# Patient Record
Sex: Male | Born: 1950
Health system: Southern US, Community
[De-identification: ages and names within clinical notes are randomized; demographics above are authoritative.]

## PROBLEM LIST (undated history)

## (undated) DIAGNOSIS — M1990 Unspecified osteoarthritis, unspecified site: Secondary | ICD-10-CM

## (undated) DIAGNOSIS — I10 Essential (primary) hypertension: Secondary | ICD-10-CM

## (undated) DIAGNOSIS — K219 Gastro-esophageal reflux disease without esophagitis: Secondary | ICD-10-CM

## (undated) DIAGNOSIS — N400 Enlarged prostate without lower urinary tract symptoms: Secondary | ICD-10-CM

## (undated) DIAGNOSIS — T300 Burn of unspecified body region, unspecified degree: Secondary | ICD-10-CM

## (undated) DIAGNOSIS — T753XXA Motion sickness, initial encounter: Secondary | ICD-10-CM

## (undated) DIAGNOSIS — T7840XA Allergy, unspecified, initial encounter: Secondary | ICD-10-CM

## (undated) DIAGNOSIS — E785 Hyperlipidemia, unspecified: Secondary | ICD-10-CM

## (undated) HISTORY — DX: Hyperlipidemia, unspecified: E78.5

## (undated) HISTORY — DX: Benign prostatic hyperplasia without lower urinary tract symptoms: N40.0

## (undated) HISTORY — DX: Motion sickness, initial encounter: T75.3XXA

## (undated) HISTORY — DX: Burn of unspecified body region, unspecified degree: T30.0

## (undated) HISTORY — DX: Essential (primary) hypertension: I10

## (undated) HISTORY — DX: Unspecified osteoarthritis, unspecified site: M19.90

## (undated) HISTORY — DX: Gastro-esophageal reflux disease without esophagitis: K21.9

## (undated) HISTORY — DX: Allergy, unspecified, initial encounter: T78.40XA

---

## 2002-06-03 ENCOUNTER — Emergency Department (HOSPITAL_COMMUNITY): Admission: EM | Admit: 2002-06-03 | Discharge: 2002-06-03 | Payer: Self-pay | Admitting: Emergency Medicine

## 2002-06-04 ENCOUNTER — Encounter: Payer: Self-pay | Admitting: Emergency Medicine

## 2002-06-05 ENCOUNTER — Emergency Department (HOSPITAL_COMMUNITY): Admission: EM | Admit: 2002-06-05 | Discharge: 2002-06-05 | Payer: Self-pay | Admitting: Emergency Medicine

## 2005-01-31 ENCOUNTER — Emergency Department (HOSPITAL_COMMUNITY): Admission: EM | Admit: 2005-01-31 | Discharge: 2005-01-31 | Payer: Self-pay | Admitting: Podiatry

## 2005-02-06 ENCOUNTER — Encounter: Payer: Self-pay | Admitting: Family Medicine

## 2005-02-06 ENCOUNTER — Ambulatory Visit: Payer: Self-pay | Admitting: Internal Medicine

## 2005-02-08 ENCOUNTER — Encounter: Payer: Self-pay | Admitting: Family Medicine

## 2005-02-08 ENCOUNTER — Ambulatory Visit (HOSPITAL_COMMUNITY): Admission: RE | Admit: 2005-02-08 | Discharge: 2005-02-08 | Payer: Self-pay | Admitting: Internal Medicine

## 2005-02-08 ENCOUNTER — Ambulatory Visit: Payer: Self-pay | Admitting: Internal Medicine

## 2005-02-15 ENCOUNTER — Ambulatory Visit: Payer: Self-pay | Admitting: Internal Medicine

## 2005-03-01 ENCOUNTER — Ambulatory Visit: Payer: Self-pay | Admitting: Internal Medicine

## 2006-10-13 ENCOUNTER — Emergency Department (HOSPITAL_COMMUNITY): Admission: EM | Admit: 2006-10-13 | Discharge: 2006-10-13 | Payer: Self-pay | Admitting: Emergency Medicine

## 2006-10-19 ENCOUNTER — Ambulatory Visit: Payer: Self-pay | Admitting: Internal Medicine

## 2006-10-19 ENCOUNTER — Encounter: Payer: Self-pay | Admitting: Family Medicine

## 2006-10-19 LAB — CONVERTED CEMR LAB
AST: 24 units/L (ref 0–37)
Albumin: 4 g/dL (ref 3.5–5.2)
BUN: 23 mg/dL (ref 6–23)
Basophils Absolute: 0 10*3/uL (ref 0.0–0.1)
Basophils Relative: 0.3 % (ref 0.0–1.0)
Bilirubin Urine: NEGATIVE
Bilirubin, Direct: 0.1 mg/dL (ref 0.0–0.3)
CO2: 30 meq/L (ref 19–32)
Calcium: 9.4 mg/dL (ref 8.4–10.5)
Cholesterol: 255 mg/dL (ref 0–200)
Creatinine, Ser: 1 mg/dL (ref 0.4–1.5)
GFR calc Af Amer: 100 mL/min
HDL: 86.8 mg/dL (ref >39.0)
Hemoglobin, Urine: NEGATIVE
Ketones, ur: NEGATIVE mg/dL
MCHC: 33.5 g/dL (ref 30.0–36.0)
Monocytes Absolute: 0.6 10*3/uL (ref 0.2–0.7)
Monocytes Relative: 7.6 % (ref 3.0–11.0)
PSA: 2.07 ng/mL (ref 0.10–4.00)
Platelets: 245 10*3/uL (ref 150–400)
Potassium: 3.8 meq/L (ref 3.5–5.1)
RDW: 12.1 % (ref 11.5–14.6)
Specific Gravity, Urine: 1.025 (ref 1.000–1.03)
Total CHOL/HDL Ratio: 2.9
Total Protein, Urine: NEGATIVE mg/dL
Triglycerides: 137 mg/dL (ref 0–149)
Urine Glucose: NEGATIVE mg/dL
VLDL: 27 mg/dL (ref 0–40)
pH: 6 (ref 5.0–8.0)

## 2006-10-22 ENCOUNTER — Ambulatory Visit: Payer: Self-pay | Admitting: Internal Medicine

## 2007-03-12 ENCOUNTER — Ambulatory Visit: Payer: Self-pay | Admitting: Internal Medicine

## 2007-03-19 ENCOUNTER — Encounter: Payer: Self-pay | Admitting: Family Medicine

## 2007-03-19 ENCOUNTER — Encounter: Admission: RE | Admit: 2007-03-19 | Discharge: 2007-03-19 | Payer: Self-pay | Admitting: Internal Medicine

## 2007-06-20 ENCOUNTER — Encounter: Payer: Self-pay | Admitting: Internal Medicine

## 2007-07-18 HISTORY — PX: OTHER SURGICAL HISTORY: SHX169

## 2008-09-01 ENCOUNTER — Ambulatory Visit: Payer: Self-pay | Admitting: Family Medicine

## 2008-09-01 DIAGNOSIS — M549 Dorsalgia, unspecified: Secondary | ICD-10-CM | POA: Insufficient documentation

## 2008-09-01 DIAGNOSIS — N400 Enlarged prostate without lower urinary tract symptoms: Secondary | ICD-10-CM

## 2008-09-02 ENCOUNTER — Ambulatory Visit: Payer: Self-pay | Admitting: Family Medicine

## 2008-09-02 DIAGNOSIS — E7800 Pure hypercholesterolemia, unspecified: Secondary | ICD-10-CM

## 2008-09-04 LAB — CONVERTED CEMR LAB
ALT: 21 units/L (ref 0–53)
AST: 23 units/L (ref 0–37)
Basophils Relative: 0.2 % (ref 0.0–3.0)
Bilirubin, Direct: 0.1 mg/dL (ref 0.0–0.3)
CO2: 28 meq/L (ref 19–32)
Calcium: 9.2 mg/dL (ref 8.4–10.5)
Chloride: 104 meq/L (ref 96–112)
Creatinine, Ser: 1 mg/dL (ref 0.4–1.5)
Direct LDL: 159.6 mg/dL
Eosinophils Relative: 1.3 % (ref 0.0–5.0)
Glucose, Bld: 85 mg/dL (ref 70–99)
Hemoglobin: 14.9 g/dL (ref 13.0–17.0)
Lymphocytes Relative: 21.1 % (ref 12.0–46.0)
Monocytes Relative: 7.8 % (ref 3.0–12.0)
Neutro Abs: 3.4 10*3/uL (ref 1.4–7.7)
PSA: 2.47 ng/mL (ref 0.10–4.00)
RBC: 4.95 M/uL (ref 4.22–5.81)
Total CHOL/HDL Ratio: 4.2
Total Protein: 7.4 g/dL (ref 6.0–8.3)
VLDL: 22 mg/dL (ref 0–40)
WBC: 4.9 10*3/uL (ref 4.5–10.5)

## 2008-09-08 ENCOUNTER — Ambulatory Visit: Payer: Self-pay | Admitting: Family Medicine

## 2008-12-01 ENCOUNTER — Ambulatory Visit: Payer: Self-pay | Admitting: Family Medicine

## 2008-12-01 LAB — CONVERTED CEMR LAB
Direct LDL: 147.5 mg/dL
HDL: 49.1 mg/dL (ref >39.00)
Total CHOL/HDL Ratio: 4
Vitamin B-12: 322 pg/mL (ref 211–911)

## 2008-12-03 LAB — CONVERTED CEMR LAB: Testosterone Free: 135.2 pg/mL (ref 47.0–244.0)

## 2008-12-07 ENCOUNTER — Ambulatory Visit: Payer: Self-pay | Admitting: Family Medicine

## 2009-03-09 ENCOUNTER — Ambulatory Visit: Payer: Self-pay | Admitting: Family Medicine

## 2009-03-19 LAB — CONVERTED CEMR LAB
Direct LDL: 144.1 mg/dL
Total CHOL/HDL Ratio: 4
Triglycerides: 83 mg/dL (ref 0.0–149.0)
VLDL: 16.6 mg/dL (ref 0.0–40.0)

## 2009-06-30 ENCOUNTER — Ambulatory Visit: Payer: Self-pay | Admitting: Family Medicine

## 2009-07-01 LAB — CONVERTED CEMR LAB
ALT: 17 units/L (ref 0–53)
AST: 25 units/L (ref 0–37)
Direct LDL: 136.8 mg/dL
HDL: 52.5 mg/dL (ref >39.00)

## 2010-03-14 ENCOUNTER — Encounter: Payer: Self-pay | Admitting: Family Medicine

## 2010-03-14 ENCOUNTER — Ambulatory Visit: Payer: Self-pay | Admitting: Family Medicine

## 2010-03-17 ENCOUNTER — Encounter (INDEPENDENT_AMBULATORY_CARE_PROVIDER_SITE_OTHER): Payer: Self-pay | Admitting: *Deleted

## 2010-03-18 ENCOUNTER — Ambulatory Visit: Payer: Self-pay | Admitting: Family Medicine

## 2010-08-14 LAB — CONVERTED CEMR LAB
Glucose, Urine, Semiquant: NEGATIVE
Nitrite: NEGATIVE
Protein, U semiquant: NEGATIVE
Specific Gravity, Urine: 1.02
Urobilinogen, UA: 0.2
WBC Urine, dipstick: NEGATIVE

## 2010-08-16 NOTE — Letter (Signed)
Summary: Out of Work  [facility]  [address] [place], [place] [address]   Phone: [phone]  Fax: [phone]    March 17, 2010   Employee:  LEVENT KORNEGAY    To Whom It May Concern:   For Medical reasons, please excuse the above named employee from work for the following dates:  Start:   March 14 2010  End:  March 17, 2010 9:36 AM May return to work today   If you need additional information, please feel free to contact our office.         Sincerely,   Hannah Beat MD

## 2010-08-16 NOTE — Assessment & Plan Note (Signed)
Summary: ACU FOR HURT BACK AND NECK FROM BAD FALL/JRR   Vital Signs:  Patient profile:   60 year old male Height:      63 inches Weight:      114.8 pounds BMI:     20.41 Temp:     98.5 degrees F oral Pulse rate:   64 / minute Pulse rhythm:   regular BP sitting:   140 / 80  (left arm) Cuff size:   regular  Vitals Entered By: Benny Lennert CMA Duncan Dull) (March 14, 2010 11:02 AM)  History of Present Illness: Chief complaint patient complains of back pain from fall off ladder yesterday  60 year old male, fell off of ladder:  date of injury March 14, 1999  Left knee, patient struck the medial aspect of his left knee. Now he has a significant effusion is having pain with flexion. No known prior injuries, fracture, traumatic injury or operative interventions. He does have some significant pain. There is some bruising on the medial aspect.  left elbow, abrasion is present. Full range of motion.  back pain hurting pretty bad did not hurt that back that much then.  doesn't history of some back pain, now he has pain in the bilateral paraspinous areas. Initially at the time of accident, he had minimal pain, but now he has some significant pain. No numbness or tingling. No radiculopathy. No bowel or bladder incontinence. No saddle anesthesia.   Allergies (verified): No Known Drug Allergies  Past History:  Past medical, surgical, family and social histories (including risk factors) reviewed, and no changes noted (except as noted below).  Past Surgical History: Reviewed history from 09/01/2008 and no changes required. epidural injections in 2009 Denies surgical history  Family History: Reviewed history from 09/01/2008 and no changes required. father: CAD, MI age 64 mother: lung cancer brother: bcak problems sister: shoulder issue  Social History: Reviewed history from 09/01/2008 and no changes required. 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 Otherwise the pertinent positives of the ROS are noted above.    Physical Exam  General:  GEN: Well-developed,well-nourished,in no acute distress; alert,appropriate and cooperative throughout examination HEENT: Normocephalic and atraumatic without obvious abnormalities. No apparent alopecia or balding. Ears, externally no deformities PULM: Breathing comfortably in no respiratory distress EXT: No clubbing, cyanosis, or edema PSYCH: Normally interactive. Cooperative during the interview. Pleasant. Friendly and conversant. Not anxious or depressed appearing. Normal, full affect.  Msk:  left knee: Tender to palpation medially. Stable MCL, LCL. Stable ACL and PCL. Negative Lachman. Large ballotable effusion. Bruising medially. All other special testing or equivocal given the effusion.  Left elbow, full range of motion. Nontender throughout. Abrasion is present Additional Exam:  Normal Greater trochanteric bursae Full hip ROM Negative Faber Negative Reverse Faber Sciatic Notches: ttp tender to palpation of the bilateral paraspinous region, however there is no midline spinous process tenderness Sensation to Gross touch WNL Sensation to pinpricnk WNL DTR 2+ knee and ankle no clonus DP and PT pulses are normal B    Impression & Recommendations:  Problem # 1:  BACK PAIN (ICD-724.5) Lumbar spine, 5 v some evidence of spondylarthropathy, but no acute fracture on my read  post fall pain, trauma meds below activity mod heat, massage  out of work 1 week  His updated medication list for this problem includes:    Diclofenac Sodium 75 Mg Tbec (Diclofenac  sodium) .Marland Kitchen... 1 by mouth bid . take with food    Tizanidine Hcl 4 Mg Tabs (Tizanidine hcl) .Marland Kitchen... 1 by mouth at bedtime as needed back pain    Tramadol Hcl 50 Mg Tabs (Tramadol hcl) .Marland Kitchen... 1 by mouth q 6 hours as needed pain  Orders: T-Lumbar Spine Complete, 5 Views (25366YQ)  Problem # 2:  KNEE PAIN, LEFT (ICD-719.46) cannot rule out occult internal derangement. No occult fracture. Meniscal injury possible. ligament integrity is intact.  His updated medication list for this problem includes:    Diclofenac Sodium 75 Mg Tbec (Diclofenac sodium) .Marland Kitchen... 1 by mouth bid . take with food    Tizanidine Hcl 4 Mg Tabs (Tizanidine hcl) .Marland Kitchen... 1 by mouth at bedtime as needed back pain    Tramadol Hcl 50 Mg Tabs (Tramadol hcl) .Marland Kitchen... 1 by mouth q 6 hours as needed pain  Orders: T-DG Knee 4V w/ Sunrise/Patella *L* (03474.4)  Problem # 3:  KNEE PAIN, LEFT (ICD-719.46) 4 view, x-ray left knee trauma series Indication pain No evidence of occult fracture. Koren Bound is present, however her only minimal arthritic changes and no other occult disease  His updated medication list for this problem includes:    Diclofenac Sodium 75 Mg Tbec (Diclofenac sodium) .Marland Kitchen... 1 by mouth bid . take with food    Tizanidine Hcl 4 Mg Tabs (Tizanidine hcl) .Marland Kitchen... 1 by mouth at bedtime as needed back pain    Tramadol Hcl 50 Mg Tabs (Tramadol hcl) .Marland Kitchen... 1 by mouth q 6 hours as needed pain  Orders: T-DG Knee 4V w/ Sunrise/Patella *L* (25956.4)  Complete Medication List: 1)  Red Yeast Rice 600 Mg Caps (Red yeast rice extract) .... 2400 mg divided daily 2)  Diclofenac Sodium 75 Mg Tbec (Diclofenac sodium) .Marland Kitchen.. 1 by mouth bid . take with food 3)  Tizanidine Hcl 4 Mg Tabs (Tizanidine hcl) .Marland Kitchen.. 1 by mouth at bedtime as needed back pain 4)  Tramadol Hcl 50 Mg Tabs (Tramadol hcl) .Marland Kitchen.. 1 by mouth q 6 hours as needed pain 5)  Back Support For Work  .... 724.5  Patient Instructions: 1)  recheck in 3-4 weeks Prescriptions: BACK SUPPORT  FOR WORK 724.5  #1 x 0   Entered and Authorized by:   Hannah Beat MD   Signed by:   Hannah Beat MD on 03/14/2010   Method used:   Print then Give to Patient   RxID:   [id number] TRAMADOL HCL 50 MG TABS (TRAMADOL HCL) 1 by mouth q 6 hours as needed pain  #40 x 0   Entered and Authorized by:   Hannah Beat MD   Signed by:   Hannah Beat MD on 03/14/2010   Method used:   [facility] [id number]       [address]       [place], [place]  [address]       Ph: [phone] or [phone]       Fax: [phone]   RxID:   [id number] TIZANIDINE HCL 4 MG TABS (TIZANIDINE HCL) 1 by mouth at bedtime as needed back pain  #30 x 1   Entered and Authorized by:   Hannah Beat MD   Signed by:   Hannah Beat MD  on 03/14/2010   Method used:   [facility]        [facility] [id number]       [address]       [place], [place]  [address]       Ph: [phone] or [phone]       Fax: [phone]   RxID:   [id number] DICLOFENAC SODIUM 75 MG TBEC (DICLOFENAC SODIUM) 1 by mouth bid . take with food  #60 x 1   Entered and Authorized by:   Hannah Beat MD   Signed by:   Hannah Beat MD on 03/14/2010   Method used:   [facility]        [facility] (retail)       [address]       [place], [place]  [address]       Ph: [phone] or [phone]       Fax: [phone]   RxID:   [id number]   Current Allergies (reviewed today): No known allergies

## 2010-08-16 NOTE — Letter (Signed)
Summary: Out of Work  [facility]  [address] [place], [place] [address]   Phone: [phone]  Fax: [phone]    March 14, 2010   Employee:  Carl Harris    To Whom It May Concern:   For Medical reasons, please excuse the above named employee from work for the following dates:  Start:   03/14/2010  End:   Ok to return 03/17/2010  If you need additional information, please feel free to contact our office.         Sincerely,    Hannah Beat MD

## 2010-08-16 NOTE — Letter (Signed)
Summary: Out of Work  [facility]  [address] [place], [place] [address]   Phone: [phone]  Fax: [phone]    March 14, 2010   Employee:  Livia Snellen    To Whom It May Concern:   For Medical reasons, please excuse the above named employee from work for the following dates:  Start:   03/14/2010  End:   03/21/2010  If you need additional information, please feel free to contact our office.         Sincerely,    Hannah Beat MD

## 2010-08-23 ENCOUNTER — Telehealth (INDEPENDENT_AMBULATORY_CARE_PROVIDER_SITE_OTHER): Payer: Self-pay | Admitting: *Deleted

## 2010-08-26 ENCOUNTER — Other Ambulatory Visit: Payer: Self-pay | Admitting: Family Medicine

## 2010-08-26 ENCOUNTER — Other Ambulatory Visit (INDEPENDENT_AMBULATORY_CARE_PROVIDER_SITE_OTHER): Payer: BC Managed Care – PPO

## 2010-08-26 ENCOUNTER — Encounter (INDEPENDENT_AMBULATORY_CARE_PROVIDER_SITE_OTHER): Payer: Self-pay | Admitting: *Deleted

## 2010-08-26 DIAGNOSIS — E7800 Pure hypercholesterolemia, unspecified: Secondary | ICD-10-CM

## 2010-08-26 DIAGNOSIS — Z125 Encounter for screening for malignant neoplasm of prostate: Secondary | ICD-10-CM

## 2010-08-26 DIAGNOSIS — E785 Hyperlipidemia, unspecified: Secondary | ICD-10-CM

## 2010-08-26 LAB — LDL CHOLESTEROL, DIRECT: Direct LDL: 153.6 mg/dL

## 2010-08-26 LAB — BASIC METABOLIC PANEL
BUN: 18 mg/dL (ref 6–23)
CO2: 30 mEq/L (ref 19–32)
Calcium: 9.2 mg/dL (ref 8.4–10.5)
Creatinine, Ser: 1 mg/dL (ref 0.4–1.5)
GFR: 81.46 mL/min (ref >60.00)
Glucose, Bld: 86 mg/dL (ref 70–99)
Sodium: 140 mEq/L (ref 135–145)

## 2010-08-26 LAB — LIPID PANEL
Cholesterol: 224 mg/dL — ABNORMAL HIGH (ref 0–200)
HDL: 54.1 mg/dL (ref >39.00)
Triglycerides: 150 mg/dL — ABNORMAL HIGH (ref 0.0–149.0)

## 2010-08-26 LAB — HEPATIC FUNCTION PANEL
Albumin: 3.7 g/dL (ref 3.5–5.2)
Total Protein: 6.7 g/dL (ref 6.0–8.3)

## 2010-08-26 LAB — PSA: PSA: 3.45 ng/mL (ref 0.10–4.00)

## 2010-08-30 ENCOUNTER — Encounter: Payer: Self-pay | Admitting: Family Medicine

## 2010-08-30 ENCOUNTER — Encounter (INDEPENDENT_AMBULATORY_CARE_PROVIDER_SITE_OTHER): Payer: BC Managed Care – PPO | Admitting: Family Medicine

## 2010-08-30 DIAGNOSIS — R079 Chest pain, unspecified: Secondary | ICD-10-CM | POA: Insufficient documentation

## 2010-08-30 DIAGNOSIS — M5430 Sciatica, unspecified side: Secondary | ICD-10-CM

## 2010-08-30 DIAGNOSIS — Z0000 Encounter for general adult medical examination without abnormal findings: Secondary | ICD-10-CM

## 2010-08-30 DIAGNOSIS — Z136 Encounter for screening for cardiovascular disorders: Secondary | ICD-10-CM

## 2010-08-30 DIAGNOSIS — E7800 Pure hypercholesterolemia, unspecified: Secondary | ICD-10-CM

## 2010-08-30 DIAGNOSIS — Z87898 Personal history of other specified conditions: Secondary | ICD-10-CM

## 2010-08-30 DIAGNOSIS — R3915 Urgency of urination: Secondary | ICD-10-CM | POA: Insufficient documentation

## 2010-08-30 DIAGNOSIS — R0789 Other chest pain: Secondary | ICD-10-CM | POA: Insufficient documentation

## 2010-08-30 DIAGNOSIS — R300 Dysuria: Secondary | ICD-10-CM

## 2010-08-30 DIAGNOSIS — Z23 Encounter for immunization: Secondary | ICD-10-CM

## 2010-09-01 NOTE — Progress Notes (Signed)
----   Converted from flag ---- ---- 08/23/2010 8:30 AM, Kerby Nora MD wrote: PSA Dx v76.44, CMET , lipids Dx v77.91  ---- 08/23/2010 7:53 AM, Liane Comber CMA (AAMA) wrote: Lab orders please! Good Morning! This pt is scheduled for cpx labs Friday, which labs to draw and dx codes to use? Thanks Tasha ------------------------------

## 2010-09-07 NOTE — Letter (Signed)
Summary: [facility]: Immunoassay Fecal Occult Blood (iFOB) Order Form  [facility]  [address] [place], [place] [address]   Phone: [phone]  Fax: [phone]       Lab: Immunoassay Fecal Occult Blood (iFOB) Order Form   August 30, 2010 MRN: [id number]   Carl Harris 09/07/1950   Physicican Name:____bedsole_____________________  Diagnosis Code:______v76.41____________________      Kerby Nora MD

## 2010-09-07 NOTE — Assessment & Plan Note (Signed)
Summary: cpx /rbh bcbs   Vital Signs:  Patient profile:   60 year old male Height:      63 inches Weight:      116.50 pounds BMI:     20.71 Temp:     97.6 degrees F oral Pulse rate:   65 / minute Pulse rhythm:   regular BP sitting:   90 / 72  (left arm) Cuff size:   regular  Vitals Entered By: Benny Lennert CMA Duncan Dull) (August 30, 2010 8:04 AM)  History of Present Illness: Chief complaint cpx  The patient is here for annual wellness exam and preventative care.      High cholesterol:  inadequate control. Not taking red yeast rice.  Trying to eat healthy  Chronic back pain... worsened symptoms 3 weeks ago.  Sharp pain in low back radiating to left buttock and down left leg. Pain increases with leaning forward. Gradually improving in last week. Not currently using and medication Has history of having steroid injection.  2 months ago.. was cutting tree.. limb flet directly on head... saw black for 1-2 seconds. head sore after this. No neck pain, no vision change, no confusion.   Occ chest tightness... once a month. Lasts few seconds. No radiation. No exertional pain. No relationship to eating. No associated symtpoms. Father: with CAD age 58. Never had stress test.    Hx of BPH Urination slower than usual for past year. Doesn't feel like empties out completely.  Has urinary urgency as well. Only urinating once at night.   No dysuria, no hematuria. Not bothering him enough to take daily medicine.  Problems Prior to Update: 1)  Chest Pain, Acute  (ICD-786.50) 2)  Sciatica, Left  (ICD-724.3) 3)  Urinary Urgency  (WUJ-811.91) 4)  Special Screening Malignant Neoplasm of Prostate  (ICD-V76.44) 5)  Well Adult Exam  (ICD-V70.0) 6)  Hypercholesterolemia  (ICD-272.0) 7)  Benign Prostatic Hypertrophy, Hx of  (ICD-V13.8) 8)  Back Pain, Chronic  (ICD-724.5)  Current Medications (verified): 1)  None  Allergies (verified): No Known Drug Allergies  Past  History:  Past medical, surgical, family and social histories (including risk factors) reviewed, and no changes noted (except as noted below).  Past Surgical History: Reviewed history from 09/01/2008 and no changes required. epidural injections in 2009 Denies surgical history  Family History: Reviewed history from 09/01/2008 and no changes required. father: CAD, MI age 96 mother: lung cancer brother: bcak problems sister: shoulder issue  Social History: Reviewed history from 09/01/2008 and no changes required. 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  Physical Exam  General:  Well-developed,well-nourished,in no acute distress; alert,appropriate and cooperative throughout examination Eyes:  No corneal or conjunctival inflammation noted. EOMI. Perrla. Funduscopic exam benign, without hemorrhages, exudates or papilledema. Vision grossly normal. Ears:  External ear exam shows no significant lesions or deformities.  Otoscopic examination reveals clear canals, tympanic membranes are intact bilaterally without bulging, retraction, inflammation or discharge. Hearing is grossly normal bilaterally. Nose:  External nasal examination shows no deformity or inflammation. Nasal mucosa are pink and moist without lesions or exudates. Mouth:  Oral mucosa and oropharynx without lesions or exudates.  Teeth in good repair. Neck:  no carotid bruit or thyromegaly no cervical or supraclavicular lymphadenopathy  Lungs:  Normal respiratory effort, chest expands symmetrically. Lungs are clear to auscultation, no crackles or wheezes. Heart:  Normal rate and  regular rhythm. S1 and S2 normal without gallop, murmur, click, rub or other extra sounds. Abdomen:  Bowel sounds positive,abdomen soft and non-tender without masses, organomegaly or hernias  noted. Rectal:  No external abnormalities noted. Normal sphincter tone. No rectal masses or tenderness. Genitalia:  Testes bilaterally descended without nodularity, tenderness or masses. No scrotal masses or lesions. No penis lesions or urethral discharge. Prostate:  3+ enlarged.  No nodules, no assymetry.  Msk:  ttp in left paraspinous muscles, no central lumbar vertebral pain.  Positive left SLR, neg Faber's   Full ROM in neck, no cervical ttp Pulses:  R and L posterior tibial pulses are full and equal bilaterally  Extremities:  no edema Neurologic:  No cranial nerve deficits noted. Station and gait are normal. Plantar reflexes are down-going bilaterally. DTRs are symmetrical throughout. Sensory, motor and coordinative functions appear intact. Skin:  Intact without suspicious lesions or rashes Psych:  Cognition and judgment appear intact. Alert and cooperative with normal attention span and concentration. No apparent delusions, illusions, hallucinations   Impression & Recommendations:  Problem # 1:  WELL ADULT EXAM (ICD-V70.0) The patient's preventative maintenance and recommended screening tests for an annual wellness exam were reviewed in full today. Brought up to date unless services declined.  Counselled on the importance of diet, exercise, and its role in overall health and mortality. The patient's FH and SH was reviewed, including their home life, tobacco status, and drug and alcohol status.     Problem # 2:  CHEST PAIN, ACUTE (ICD-786.50) CAD risk factors include family history and high cholesterol,  He is a non smoker, no HTN, good HDl. EKG showed NSR, no ST changes, no Q waves.  Given risk factors and chest pain without other clear cause.. will refer for treadmill stress test.  Orders: EKG w/ Interpretation (93000) Misc. Referral (Misc. Ref)  Problem # 3:  SCIATICA, LEFT (ICD-724.3) Treat with NSAIDs, heat and gentle exercise.. if not improving.    The following  medications were removed from the medication list:    Tizanidine Hcl 4 Mg Tabs (Tizanidine hcl) .Marland Kitchen... 1 by mouth at bedtime as needed back pain    Tramadol Hcl 50 Mg Tabs (Tramadol hcl) .Marland Kitchen... 1 by mouth q 6 hours as needed pain  Problem # 4:  HYPERCHOLESTEROLEMIA (ICD-272.0) Poor control .. discussed diet and exercise. Pt still hesitant about meds... never took red yeast rice.  Info given. Recheck in 3 months.   Problem # 5:  URINARY URGENCY (EAV-409.81) Likely due to BPH history. Enlargement but no nodularity on exam... PSA increase 1 point in last 2 years....will recheck free and total.  No sign of infection. Orders: UA Dipstick W/ Micro (manual) (19147)  Problem # 6:  BENIGN PROSTATIC HYPERTROPHY, HX OF (ICD-V13.8) Per pt symtpoms not bothering him enough for a med.   Other Orders: Tdap => 71yrs IM (82956) Admin 1st Vaccine (21308)  Patient Instructions: 1)  Work on low cholesterol diet. Start regular exercsie 3-5 times a week. 2)   Recheck fasting LIPIDS, AST, ALT  in 3 months Dx 272.0 , PSa free and total Dx v76.44 3)   Return stool cards for colon cancer screening. 4)   Heat on low back ibuprofen 800 mg three times a day as needed pain, gentle stretching . 5)  Call if back pain not improving.  6)  Referral Appointment Information 7)  Day/Date: 8)  Time: 9)  Place/MD: 10)  Address: 11)  Phone/Fax: 12)  Patient given appointment information.  Information/Orders faxed/mailed.    Orders Added: 1)  Tdap => 47yrs IM [90715] 2)  Admin 1st Vaccine [90471] 3)  UA Dipstick W/ Micro (manual) [81000] 4)  EKG w/ Interpretation [93000] 5)  Est. Patient 40-64 years [99396] 6)  Est. Patient Level III [16109] 7)  Misc. Referral [Misc. Ref]   Immunizations Administered:  Tetanus Vaccine:    Vaccine Type: Tdap    Site: left deltoid    Mfr: GlaxoSmithKline    Dose: 0.5 ml    Route: IM    Given by: Benny Lennert CMA (AAMA)    Exp. Date: 05/05/2012    Lot #: [id number]    VIS  given: 06/03/08 version given August 30, 2010.   Immunizations Administered:  Tetanus Vaccine:    Vaccine Type: Tdap    Site: left deltoid    Mfr: GlaxoSmithKline    Dose: 0.5 ml    Route: IM    Given by: Benny Lennert CMA (AAMA)    Exp. Date: 05/05/2012    Lot #: [id number]    VIS given: 06/03/08 version given August 30, 2010.  Current Allergies (reviewed today): No known allergies   Flu Vaccine Result Date:  04/16/2010 Flu Vaccine Result:  given Flu Vaccine Next Due:  1 yr TD Result Date:  08/30/2010 TD Result:  given TD Next Due:  10 yr Flex Sig Next Due:  Not Indicated Colonoscopy Next Due:  Refused      Appended Document: cpx /rbh bcbs     Allergies: No Known Drug Allergies   Other Orders: UA Dipstick w/o Micro (manual) (13086)   Orders Added: 1)  UA Dipstick w/o Micro (manual) [81002]     Laboratory Results   Urine Tests  Date/Time Received: August 30, 2010 9:06 AM   Routine Urinalysis   Color: yellow Appearance: Clear Glucose: negative   (Normal Range: Negative) Bilirubin: negative   (Normal Range: Negative) Ketone: negative   (Normal Range: Negative) Spec. Gravity: 1.015   (Normal Range: 1.003-1.035) Blood: negative   (Normal Range: Negative) pH: 6.0   (Normal Range: 5.0-8.0) Protein: trace   (Normal Range: Negative) Urobilinogen: 0.2   (Normal Range: 0-1) Nitrite: negative   (Normal Range: Negative) Leukocyte Esterace: negative   (Normal Range: Negative)

## 2010-09-08 ENCOUNTER — Ambulatory Visit (INDEPENDENT_AMBULATORY_CARE_PROVIDER_SITE_OTHER): Payer: BC Managed Care – PPO | Admitting: Cardiovascular Disease

## 2010-09-08 ENCOUNTER — Encounter: Payer: Self-pay | Admitting: Cardiovascular Disease

## 2010-09-08 ENCOUNTER — Encounter (INDEPENDENT_AMBULATORY_CARE_PROVIDER_SITE_OTHER): Payer: BC Managed Care – PPO

## 2010-09-08 DIAGNOSIS — E785 Hyperlipidemia, unspecified: Secondary | ICD-10-CM

## 2010-09-08 DIAGNOSIS — R079 Chest pain, unspecified: Secondary | ICD-10-CM

## 2010-09-08 DIAGNOSIS — Z8249 Family history of ischemic heart disease and other diseases of the circulatory system: Secondary | ICD-10-CM

## 2010-09-13 NOTE — Assessment & Plan Note (Signed)
Summary: Chest pain/AMD   Visit Type:  Initial Consult Primary Provider:  Kerby Nora MD  CC:  c/o feeling fatigue and shortness of breath and chest pain.Marland Kitchen  History of Present Illness: 60 yo male with h/o hyperlipidemia, strong family hx of CAD with father who passed with MI at age 47, presents with chest pain epsiodes.  He reports having episodes of chest pain once every two weeks, lasting for up to a minute at a time. He did have episodes of SOB with associated cough several years ago. No recent shortness of breath. he is otherwise active. He does not participate in an exercise program. He has been trying to watch his diet given his elevated cholesterol. More recently, he has been doing a slow jog, stretching exercises.  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  The patient denies fever, weight loss, weight gain, vision loss, decreased hearing, hoarseness, syncope, dyspnea on exertion, peripheral edema, prolonged cough, abdominal pain, incontinence, muscle weakness, depression, and enlarged lymph nodes.    Vital Signs:  Patient profile:   60 year old male Height:      63 inches Weight:       113 pounds BMI:     20.09 Pulse rate:   68 / minute BP sitting:   98 / 68  (left arm) Cuff size:   regular  Vitals Entered By: Bishop Dublin, CMA (September 08, 2010 9:02 AM)  Physical Exam  General:  Well developed, well nourished, in no acute distress. Head:  normocephalic and atraumatic Neck:  Neck supple, no JVD. No masses, thyromegaly or abnormal cervical nodes. Lungs:  Clear bilaterally to auscultation and percussion. Heart:  Non-displaced PMI, chest non-tender; regular rate and rhythm, S1, S2 without murmurs, rubs or gallops. Carotid upstroke normal, no bruit.  Pedals normal pulses. No edema, no varicosities. Abdomen:  Bowel sounds positive; abdomen soft and non-tender without masses Msk:  Back normal, normal gait. Muscle strength and tone normal. Pulses:  pulses normal in all 4 extremities Extremities:  No clubbing or cyanosis. Neurologic:  Alert and oriented x 3. Skin:  Intact without lesions or rashes. Psych:  Normal affect.   Impression & Recommendations:  Problem # 1:  CHEST PAIN, ACUTE (ICD-786.50) etiology of chest pain is uncertain. It does come on at rest and with exertion there was brief and no other associated symptoms. As he does have a strong family history of coronary artery disease, and he himself has very elevated cholesterol, we will have him complete a exercise treadmill study to rule out ischemia.  We have suggested he start aspirin 81 mg daily  His updated medication list for this problem includes:    Aspirin 81 Mg Tabs (Aspirin) .Marland Kitchen... Take one tablet once daily  Problem # 2:  HYPERCHOLESTEROLEMIA (ICD-272.0) we  have talked to him about his cholesterol. He has been elevated over the past 3 or more years. Given his strong family history of coronary artery disease and MI with death his father in his early 50s, we have suggested he try a low-dose cholesterol medication. My suspicion is that given he is very slim, diet and exercise will have only mild  improvement in his numbers.  We will start Lipitor 10 mg daily with a check of his cholesterol and LFTs with Dr. Ermalene Searing in 3 months time. We will have him [facility] weeks if he has no significant symptoms from Lipitor 10 mg.  His updated medication list for this problem includes:    Lipitor 10 Mg Tabs (Atorvastatin calcium) .Marland Kitchen... Take one tablet at bedtime  Patient Instructions: 1)  Your physician recommends that you schedule a follow-up appointment in: as needed 2)  Your physician has recommended you make the following change in your medication: Start Lipitor 10 mg one tablet at bedtime. Start aspirin 81 mg take one tablet once daily. Prescriptions: LIPITOR 10 MG TABS (ATORVASTATIN CALCIUM) take one tablet at bedtime  #30 x 1   Entered by:   Bishop Dublin, CMA   Authorized by:   Dossie Arbour MD   Signed by:   Bishop Dublin, CMA on 09/08/2010   Method used:   [facility]       [address]       [place], [place]  [address]       Ph: [phone] or [phone]       Fax: [phone]   RxID:   [id number]

## 2010-09-13 NOTE — Assessment & Plan Note (Signed)
Summary: Treadmill Visit   History of Present Illness: Patient presents for treadmill with recent episodes of chest pain, one episode every 2 weeks lasting for up to one minute also history of high cholesterol, strong family history of coronary artery disease.  Resting EKG shows normal sinus rhythm with rate 70 beats per minute, nonspecific ST changes in the anterolateral leads and inferior leads. resting blood pressure 98/68  He exercised on a regular Bruce protocol. Target heart rate was 137 beats per minute. He achieved a heart rate of 144 beats per minute (89%). He exercised for 10 minutes, achieved 12.9 METS, no significant ST depressions in anterolateral leads concerning for ischemia. peak blood pressure 120/80. he had no symptoms of chest pain. He did have some fatigue and shortness of breath at peak exercise. Heart rate after 3 minutes in recovery improved to 90 beats per minute with blood pressure 100/58.  Final impression; Normal exercise treadmill study with no EKG changes concerning for ischemia. Good exercise tolerance with no symptoms of chest discomfort. No further testing indicated at this time. As discussed in the visit note today, he was started on aspirin 81 mg daily and Lipitor 10 mg daily.  Allergies: No Known Drug Allergies

## 2010-10-14 ENCOUNTER — Other Ambulatory Visit: Payer: BC Managed Care – PPO

## 2010-10-14 DIAGNOSIS — Z1212 Encounter for screening for malignant neoplasm of rectum: Secondary | ICD-10-CM

## 2010-11-28 ENCOUNTER — Other Ambulatory Visit: Payer: Self-pay | Admitting: Family Medicine

## 2010-11-28 DIAGNOSIS — E7800 Pure hypercholesterolemia, unspecified: Secondary | ICD-10-CM

## 2010-11-28 DIAGNOSIS — Z125 Encounter for screening for malignant neoplasm of prostate: Secondary | ICD-10-CM

## 2010-11-29 ENCOUNTER — Other Ambulatory Visit (INDEPENDENT_AMBULATORY_CARE_PROVIDER_SITE_OTHER): Payer: BC Managed Care – PPO | Admitting: Family Medicine

## 2010-11-29 DIAGNOSIS — N400 Enlarged prostate without lower urinary tract symptoms: Secondary | ICD-10-CM

## 2010-11-29 DIAGNOSIS — E7800 Pure hypercholesterolemia, unspecified: Secondary | ICD-10-CM

## 2010-11-29 LAB — LIPID PANEL
Cholesterol: 225 mg/dL — ABNORMAL HIGH (ref 0–200)
Total CHOL/HDL Ratio: 4
Triglycerides: 106 mg/dL (ref 0.0–149.0)

## 2010-11-30 LAB — PSA, TOTAL AND FREE
PSA, Free Pct: 13 % — ABNORMAL LOW (ref >25)
PSA, Free: 0.7 ng/mL
PSA: 5.24 ng/mL — ABNORMAL HIGH (ref <=4.00)

## 2010-12-02 ENCOUNTER — Telehealth: Payer: Self-pay | Admitting: Family Medicine

## 2010-12-02 DIAGNOSIS — R972 Elevated prostate specific antigen [PSA]: Secondary | ICD-10-CM

## 2010-12-02 DIAGNOSIS — Z87898 Personal history of other specified conditions: Secondary | ICD-10-CM

## 2010-12-02 NOTE — Telephone Encounter (Signed)
Message copied by Kerby Nora on Fri Dec 02, 2010  1:09 PM ------      Message from: Benny Lennert      Created: Fri Dec 02, 2010  8:08 AM       Patient is agreeable. Patient wasn't taken medication on regular basis

## 2011-02-08 ENCOUNTER — Encounter: Payer: Self-pay | Admitting: Cardiovascular Disease

## 2013-03-18 ENCOUNTER — Ambulatory Visit (INDEPENDENT_AMBULATORY_CARE_PROVIDER_SITE_OTHER): Payer: BC Managed Care – PPO | Admitting: Family Medicine

## 2013-03-18 ENCOUNTER — Encounter: Payer: Self-pay | Admitting: Family Medicine

## 2013-03-18 VITALS — BP 120/80 | HR 72 | Temp 98.1°F | Ht 62.0 in | Wt 113.5 lb

## 2013-03-18 DIAGNOSIS — Z1212 Encounter for screening for malignant neoplasm of rectum: Secondary | ICD-10-CM

## 2013-03-18 DIAGNOSIS — M549 Dorsalgia, unspecified: Secondary | ICD-10-CM

## 2013-03-18 DIAGNOSIS — R972 Elevated prostate specific antigen [PSA]: Secondary | ICD-10-CM

## 2013-03-18 DIAGNOSIS — E7800 Pure hypercholesterolemia, unspecified: Secondary | ICD-10-CM

## 2013-03-18 DIAGNOSIS — Z0000 Encounter for general adult medical examination without abnormal findings: Secondary | ICD-10-CM

## 2013-03-18 DIAGNOSIS — Z23 Encounter for immunization: Secondary | ICD-10-CM

## 2013-03-18 LAB — COMPREHENSIVE METABOLIC PANEL
AST: 31 U/L (ref 0–37)
Albumin: 4 g/dL (ref 3.5–5.2)
Alkaline Phosphatase: 60 U/L (ref 39–117)
BUN: 24 mg/dL — ABNORMAL HIGH (ref 6–23)
Glucose, Bld: 83 mg/dL (ref 70–99)
Potassium: 4.2 mEq/L (ref 3.5–5.1)
Sodium: 137 mEq/L (ref 135–145)
Total Bilirubin: 0.8 mg/dL (ref 0.3–1.2)
Total Protein: 7 g/dL (ref 6.0–8.3)

## 2013-03-18 LAB — LIPID PANEL
Cholesterol: 238 mg/dL — ABNORMAL HIGH (ref 0–200)
Total CHOL/HDL Ratio: 4
Triglycerides: 155 mg/dL — ABNORMAL HIGH (ref 0.0–149.0)
VLDL: 31 mg/dL (ref 0.0–40.0)

## 2013-03-18 NOTE — Assessment & Plan Note (Signed)
Due for re-eval. 

## 2013-03-18 NOTE — Progress Notes (Signed)
  Subjective:    Patient ID: Carl Harris, male    DOB: December 30, 1950, 61 y.o.   MRN: [id number]  HPI  62 year old male presents for follow up urgent care appt for back pain. He has not been seen in a while...since 2012.  He states he needs a physical. This is what he wants to do today.  He reports back pain is  almost resolved.  He is feeling better than in past as far as fatigue. He feels better overall.  He does report some coughing fits occ and oc chest pain ( as in past 1-2 times  Year, none recently).  Non exertional chest pain and improved with massage. EKG  nml in past.  Does have family history of CAD.    Review of Systems  Constitutional: Positive for fatigue. Negative for fever.       Stable.  HENT: Negative for ear pain.   Eyes: Negative for pain.  Respiratory: Negative for shortness of breath.   Cardiovascular: Positive for chest pain. Negative for leg swelling.  Gastrointestinal: Negative for nausea, abdominal pain, diarrhea and constipation.  Genitourinary: Negative for dysuria, urgency and frequency.  Neurological: Positive for dizziness. Negative for syncope.  Psychiatric/Behavioral: Negative for behavioral problems, dysphoric mood and decreased concentration.       Objective:   Physical Exam        Assessment & Plan:  The patient's preventative maintenance and recommended screening tests for an annual wellness exam were reviewed in full today. Brought up to date unless services declined.  Counselled on the importance of diet, exercise, and its role in overall health and mortality. The patient's FH and SH was reviewed, including their home life, tobacco status, and drug and alcohol status.    Vaccines Td uptodate, will get flu vaccine stoday.   Colonoscopy: never had. Will do yearly ifob. Lab Results  Component Value Date   PSA 5.24* 11/29/2010   PSA 3.45 08/26/2010   PSA 2.47 09/02/2008

## 2013-03-18 NOTE — Patient Instructions (Addendum)
Look into insurance coverage for shingles vaccine. Flu shot given today.  Stop at lab on way out. Follow up for an appointment if cough is continuing or if chest pain recurs... Can try allergy medicaiton.

## 2013-03-19 LAB — PSA, TOTAL AND FREE: PSA: 5.93 ng/mL — ABNORMAL HIGH (ref <=4.00)

## 2013-03-21 ENCOUNTER — Telehealth: Payer: Self-pay | Admitting: Family Medicine

## 2013-03-21 DIAGNOSIS — E7800 Pure hypercholesterolemia, unspecified: Secondary | ICD-10-CM

## 2013-03-21 DIAGNOSIS — R972 Elevated prostate specific antigen [PSA]: Secondary | ICD-10-CM

## 2013-03-21 NOTE — Telephone Encounter (Signed)
Labs ordered.

## 2013-03-25 ENCOUNTER — Telehealth: Payer: Self-pay | Admitting: Family Medicine

## 2013-03-25 MED ORDER — ATORVASTATIN CALCIUM 40 MG PO TABS: 40.0000 mg | ORAL_TABLET | Freq: Every day | ORAL | Status: DC

## 2013-03-25 NOTE — Telephone Encounter (Signed)
Message copied by Excell Seltzer on Tue Mar 25, 2013  2:16 PM ------      Message from: [name]      Created: Tue Mar 25, 2013 11:48 AM       Patient stopped by office.  Labs results discussed with patient.  He is willing to increase his statins at this time.  Advised to schedule lab visit in three months to repeat PSA and Lipids.  Will forward to Dr. Ermalene Searing to send in Rx for Statins.  Patient notified to check with his pharmacy later on today or tomorrow.  Patient states understanding. ------

## 2013-06-24 ENCOUNTER — Other Ambulatory Visit (INDEPENDENT_AMBULATORY_CARE_PROVIDER_SITE_OTHER): Payer: BC Managed Care – PPO

## 2013-06-24 DIAGNOSIS — E7800 Pure hypercholesterolemia, unspecified: Secondary | ICD-10-CM

## 2013-06-24 DIAGNOSIS — R972 Elevated prostate specific antigen [PSA]: Secondary | ICD-10-CM

## 2013-06-24 LAB — LIPID PANEL
Cholesterol: 295 mg/dL — ABNORMAL HIGH (ref 0–200)
HDL: 63.4 mg/dL (ref >39.00)
Triglycerides: 120 mg/dL (ref 0.0–149.0)
VLDL: 24 mg/dL (ref 0.0–40.0)

## 2013-06-24 LAB — COMPREHENSIVE METABOLIC PANEL
BUN: 24 mg/dL — ABNORMAL HIGH (ref 6–23)
CO2: 25 mEq/L (ref 19–32)
Calcium: 9.5 mg/dL (ref 8.4–10.5)
Chloride: 111 mEq/L (ref 96–112)
Creatinine, Ser: 1.3 mg/dL (ref 0.4–1.5)
GFR: 61.8 mL/min (ref >60.00)
Total Bilirubin: 1 mg/dL (ref 0.3–1.2)

## 2013-06-24 LAB — LDL CHOLESTEROL, DIRECT: Direct LDL: 228.5 mg/dL

## 2013-06-25 LAB — PSA, TOTAL AND FREE: PSA, Free Pct: 21 % — ABNORMAL LOW (ref >25)

## 2013-07-01 ENCOUNTER — Encounter: Payer: Self-pay | Admitting: *Deleted

## 2014-04-22 ENCOUNTER — Other Ambulatory Visit (INDEPENDENT_AMBULATORY_CARE_PROVIDER_SITE_OTHER): Payer: BC Managed Care – PPO

## 2014-04-22 ENCOUNTER — Telehealth: Payer: Self-pay | Admitting: Family Medicine

## 2014-04-22 DIAGNOSIS — E7800 Pure hypercholesterolemia, unspecified: Secondary | ICD-10-CM

## 2014-04-22 DIAGNOSIS — R972 Elevated prostate specific antigen [PSA]: Secondary | ICD-10-CM

## 2014-04-22 LAB — COMPREHENSIVE METABOLIC PANEL
ALK PHOS: 67 U/L (ref 39–117)
ALT: 18 U/L (ref 0–53)
AST: 26 U/L (ref 0–37)
Albumin: 3.6 g/dL (ref 3.5–5.2)
BUN: 22 mg/dL (ref 6–23)
CO2: 19 mEq/L (ref 19–32)
Calcium: 9.4 mg/dL (ref 8.4–10.5)
Chloride: 110 mEq/L (ref 96–112)
Creatinine, Ser: 1.1 mg/dL (ref 0.4–1.5)
GFR: 72.85 mL/min (ref >60.00)
Glucose, Bld: 88 mg/dL (ref 70–99)
Potassium: 4.4 mEq/L (ref 3.5–5.1)
SODIUM: 144 meq/L (ref 135–145)
Total Bilirubin: 0.9 mg/dL (ref 0.2–1.2)
Total Protein: 7.2 g/dL (ref 6.0–8.3)

## 2014-04-22 LAB — LIPID PANEL
CHOLESTEROL: 239 mg/dL — AB (ref 0–200)
HDL: 48.7 mg/dL (ref >39.00)
LDL CALC: 159 mg/dL — AB (ref 0–99)
NonHDL: 190.3
TRIGLYCERIDES: 155 mg/dL — AB (ref 0.0–149.0)
Total CHOL/HDL Ratio: 5
VLDL: 31 mg/dL (ref 0.0–40.0)

## 2014-04-22 NOTE — Telephone Encounter (Signed)
Message copied by Jinny Sanders on Wed Apr 22, 2014 10:55 AM ------      Message from: [name]      Created: Tue Apr 21, 2014  3:49 PM      Regarding: Lab orders for Wednesday, 10.7.15       Patient is scheduled for CPX labs, please order future labs, Thanks , Carl Harris       ------

## 2014-04-23 ENCOUNTER — Encounter: Payer: Self-pay | Admitting: Family Medicine

## 2014-04-23 ENCOUNTER — Ambulatory Visit (INDEPENDENT_AMBULATORY_CARE_PROVIDER_SITE_OTHER): Payer: BC Managed Care – PPO | Admitting: Family Medicine

## 2014-04-23 VITALS — BP 110/70 | HR 72 | Temp 98.3°F | Ht 61.5 in | Wt 111.0 lb

## 2014-04-23 DIAGNOSIS — E7800 Pure hypercholesterolemia, unspecified: Secondary | ICD-10-CM

## 2014-04-23 DIAGNOSIS — Z23 Encounter for immunization: Secondary | ICD-10-CM

## 2014-04-23 DIAGNOSIS — R972 Elevated prostate specific antigen [PSA]: Secondary | ICD-10-CM

## 2014-04-23 DIAGNOSIS — Z1211 Encounter for screening for malignant neoplasm of colon: Secondary | ICD-10-CM

## 2014-04-23 DIAGNOSIS — M19049 Primary osteoarthritis, unspecified hand: Secondary | ICD-10-CM | POA: Insufficient documentation

## 2014-04-23 DIAGNOSIS — M19042 Primary osteoarthritis, left hand: Secondary | ICD-10-CM

## 2014-04-23 LAB — PSA, TOTAL AND FREE
PSA, Free Pct: 18 % — ABNORMAL LOW (ref >25)
PSA, Free: 1.16 ng/mL
PSA: 6.28 ng/mL — ABNORMAL HIGH (ref <=4.00)

## 2014-04-23 MED ORDER — ATORVASTATIN CALCIUM 40 MG PO TABS: 40.0000 mg | ORAL_TABLET | Freq: Every day | ORAL | Status: DC

## 2014-04-23 MED ORDER — ASPIRIN 81 MG PO TABS
81.0000 mg | ORAL_TABLET | Freq: Every day | ORAL | Status: DC
Start: 2014-04-23 — End: 2020-07-02

## 2014-04-23 NOTE — Progress Notes (Signed)
Pre visit review using our clinic review tool, if applicable. No additional management support is needed unless otherwise documented below in the visit note. 

## 2014-04-23 NOTE — Assessment & Plan Note (Signed)
Increase in PSA and decrease in percent free. Will refer back to [facility] for possible biopsy.

## 2014-04-23 NOTE — Assessment & Plan Note (Signed)
Can use ibuprofen prn.

## 2014-04-23 NOTE — Assessment & Plan Note (Signed)
Not at goal. Will plan starting atorvastatin. Recheck in 6 months. Encouraged exercise, weight loss, healthy eating habits.

## 2014-04-23 NOTE — Patient Instructions (Addendum)
Increase exercise 3-5 times a week. Work on low cholesterol diet. Restart atorvastatin daily. Follow up cholesterol with labs prior in 6 months. Stop at front desk to set up referral back to urologist at [facility]. Can use ibuprofen 600 mg daily as needed for arthritis.  Pick up stool test in labs.    Fat and Cholesterol Control Diet Fat and cholesterol levels in your blood and organs are influenced by your diet. High levels of fat and cholesterol may lead to diseases of the heart, small and large blood vessels, gallbladder, liver, and pancreas. CONTROLLING FAT AND CHOLESTEROL WITH DIET Although exercise and lifestyle factors are important, your diet is key. That is because certain foods are known to raise cholesterol and others to lower it. The goal is to balance foods for their effect on cholesterol and more importantly, to replace saturated and trans fat with other types of fat, such as monounsaturated fat, polyunsaturated fat, and omega-3 fatty acids. On average, a person should consume no more than 15 to 17 g of saturated fat daily. Saturated and trans fats are considered "bad" fats, and they will raise LDL cholesterol. Saturated fats are primarily found in animal products such as meats, butter, and cream. However, that does not mean you need to give up all your favorite foods. Today, there are good tasting, low-fat, low-cholesterol substitutes for most of the things you like to eat. Choose low-fat or nonfat alternatives. Choose round or loin cuts of red meat. These types of cuts are lowest in fat and cholesterol. Chicken (without the skin), fish, veal, and ground [place] breast are great choices. Eliminate fatty meats, such as hot dogs and salami. Even shellfish have little or no saturated fat. Have a 3 oz (85 g) portion when you eat lean meat, poultry, or fish. Trans fats are also called "partially hydrogenated oils." They are oils that have been scientifically manipulated so that they are solid  at room temperature resulting in a longer shelf life and improved taste and texture of foods in which they are added. Trans fats are found in stick margarine, some tub margarines, cookies, crackers, and baked goods.  When baking and cooking, oils are a great substitute for butter. The monounsaturated oils are especially beneficial since it is believed they lower LDL and raise HDL. The oils you should avoid entirely are saturated tropical oils, such as coconut and palm.  Remember to eat a lot from food groups that are naturally free of saturated and trans fat, including fish, fruit, vegetables, beans, grains (barley, rice, couscous, bulgur wheat), and pasta (without cream sauces).  IDENTIFYING FOODS THAT LOWER FAT AND CHOLESTEROL  Soluble fiber may lower your cholesterol. This type of fiber is found in fruits such as apples, vegetables such as broccoli, potatoes, and carrots, legumes such as beans, peas, and lentils, and grains such as barley. Foods fortified with plant sterols (phytosterol) may also lower cholesterol. You should eat at least 2 g per day of these foods for a cholesterol lowering effect.  Read package labels to identify low-saturated fats, trans fat free, and low-fat foods at the supermarket. Select cheeses that have only 2 to 3 g saturated fat per ounce. Use a heart-healthy tub margarine that is free of trans fats or partially hydrogenated oil. When buying baked goods (cookies, crackers), avoid partially hydrogenated oils. Breads and muffins should be made from whole grains (whole-wheat or whole oat flour, instead of "flour" or "enriched flour"). Buy non-creamy canned soups with reduced salt and no added  fats.  FOOD PREPARATION TECHNIQUES  Never deep-fry. If you must fry, either stir-fry, which uses very little fat, or use non-stick cooking sprays. When possible, broil, bake, or roast meats, and steam vegetables. Instead of putting butter or margarine on vegetables, use lemon and herbs,  applesauce, and cinnamon (for squash and sweet potatoes). Use nonfat yogurt, salsa, and low-fat dressings for salads.  LOW-SATURATED FAT / LOW-FAT FOOD SUBSTITUTES Meats / Saturated Fat (g)  Avoid: Steak, marbled (3 oz/85 g) / 11 g  Choose: Steak, lean (3 oz/85 g) / 4 g  Avoid: Hamburger (3 oz/85 g) / 7 g  Choose: Hamburger, lean (3 oz/85 g) / 5 g  Avoid: Ham (3 oz/85 g) / 6 g  Choose: Ham, lean cut (3 oz/85 g) / 2.4 g  Avoid: Chicken, with skin, dark meat (3 oz/85 g) / 4 g  Choose: Chicken, skin removed, dark meat (3 oz/85 g) / 2 g  Avoid: Chicken, with skin, light meat (3 oz/85 g) / 2.5 g  Choose: Chicken, skin removed, light meat (3 oz/85 g) / 1 g Dairy / Saturated Fat (g)  Avoid: Whole milk (1 cup) / 5 g  Choose: Low-fat milk, 2% (1 cup) / 3 g  Choose: Low-fat milk, 1% (1 cup) / 1.5 g  Choose: Skim milk (1 cup) / 0.3 g  Avoid: Hard cheese (1 oz/28 g) / 6 g  Choose: Skim milk cheese (1 oz/28 g) / 2 to 3 g  Avoid: Cottage cheese, 4% fat (1 cup) / 6.5 g  Choose: Low-fat cottage cheese, 1% fat (1 cup) / 1.5 g  Avoid: Ice cream (1 cup) / 9 g  Choose: Sherbet (1 cup) / 2.5 g  Choose: Nonfat frozen yogurt (1 cup) / 0.3 g  Choose: Frozen fruit bar / trace  Avoid: Whipped cream (1 tbs) / 3.5 g  Choose: Nondairy whipped topping (1 tbs) / 1 g Condiments / Saturated Fat (g)  Avoid: Mayonnaise (1 tbs) / 2 g  Choose: Low-fat mayonnaise (1 tbs) / 1 g  Avoid: Butter (1 tbs) / 7 g  Choose: Extra light margarine (1 tbs) / 1 g  Avoid: Coconut oil (1 tbs) / 11.8 g  Choose: Olive oil (1 tbs) / 1.8 g  Choose: Corn oil (1 tbs) / 1.7 g  Choose: Safflower oil (1 tbs) / 1.2 g  Choose: Sunflower oil (1 tbs) / 1.4 g  Choose: Soybean oil (1 tbs) / 2.4 g  Choose: Canola oil (1 tbs) / 1 g Document Released: 07/03/2005 Document Revised: 10/28/2012 Document Reviewed: 10/01/2013 Exit[employer] Patient Information 2015 [place], [place]. This information is not intended to  replace advice given to you by your health care provider. Make sure you discuss any questions you have with your health care provider.

## 2014-04-23 NOTE — Progress Notes (Signed)
Subjective:    Patient ID: Carl Harris, male    DOB: 09-28-1950, 63 y.o.   MRN: [id number]  HPI  63 year old male presents for yearly wellness exam.  He is feeling better this year. No exertional chest pain.  Still occ has ache in chest and back with lifting heavy loads.   He has been having stiffness in B hands PIP joint. Tried aspirin, helped some.  left 3rd digit PCP is ost swollen and was most tender.  better this week.   Elevated Cholesterol: LDL not at goal < 130 on no medication. He does not like taking atorvastatin 40 mg daily.No SE. Lab Results  Component Value Date   CHOL 239* 04/22/2014   HDL 48.70 04/22/2014   LDLCALC 159* 04/22/2014   LDLDIRECT 228.5 06/24/2013   TRIG 155.0* 04/22/2014   CHOLHDL 5 04/22/2014  Using medications without problems: Muscle aches:  Diet compliance: Exercise: walks a little Other complaints:  Elevated PSA:  PSA has continued to increase. Free percent suggests 33.9% concern of prostate cancer.  No flow issues, no dysuria. No family history of prostate cancer.  Saw urologist in past , had biopsies in 2012 nml. Tough likely BPH.    Review of Systems  Constitutional: Negative for fever and fatigue.  HENT: Negative for ear pain.   Eyes: Negative for pain.  Respiratory: Negative for shortness of breath.   Cardiovascular: Negative for chest pain.  Gastrointestinal: Negative for abdominal pain.       Objective:   Physical Exam  Constitutional: Vital signs are normal. He appears well-developed and well-nourished.  Non-toxic appearance. He does not appear ill. No distress.  HENT:  Head: Normocephalic and atraumatic.  Right Ear: Hearing, tympanic membrane, external ear and ear canal normal.  Left Ear: Hearing, tympanic membrane, external ear and ear canal normal.  Nose: Nose normal.  Mouth/Throat: Uvula is midline, oropharynx is clear and moist and mucous membranes are normal.  Eyes: Conjunctivae, EOM and lids are normal. Pupils  are equal, round, and reactive to light. Lids are everted and swept, no foreign bodies found.  Neck: Trachea normal, normal range of motion and phonation normal. Neck supple. Carotid bruit is not present. No mass and no thyromegaly present.  Cardiovascular: Normal rate, regular rhythm, S1 normal, S2 normal, intact distal pulses and normal pulses.  Exam reveals no gallop, no distant heart sounds and no friction rub.   No murmur heard. No peripheral edema  Pulmonary/Chest: Effort normal and breath sounds normal. No respiratory distress. He has no wheezes. He has no rhonchi. He has no rales.  Abdominal: Soft. Normal appearance and bowel sounds are normal. There is no hepatosplenomegaly. There is no tenderness. There is no rebound, no guarding and no CVA tenderness. No hernia. Hernia confirmed negative in the right inguinal area and confirmed negative in the left inguinal area.  Genitourinary: Testes normal and penis normal. Rectal exam shows no external hemorrhoid, no internal hemorrhoid, no fissure, no mass, no tenderness and anal tone normal. Guaiac negative stool. Prostate is enlarged. Prostate is not tender. Right testis shows no mass and no tenderness. Left testis shows no mass and no tenderness. No paraphimosis or penile tenderness.  No prostate nodules felt.  Lymphadenopathy:    He has no cervical adenopathy.       Right: No inguinal adenopathy present.       Left: No inguinal adenopathy present.  Neurological: He is alert. He has normal strength and normal reflexes. No cranial nerve  deficit or sensory deficit. Gait normal.  Skin: Skin is warm, dry and intact. No rash noted.  Psychiatric: He has a normal mood and affect. His speech is normal and behavior is normal. Judgment and thought content normal.          Assessment & Plan:  The patient's preventative maintenance and recommended screening tests for an annual wellness exam were reviewed in full today. Brought up to date unless  services declined.  Counselled on the importance of diet, exercise, and its role in overall health and mortality. The patient's FH and SH was reviewed, including their home life, tobacco status, and drug and alcohol status.   Vaccines Td uptodate, will get flu vaccine today.  Refuse shingels vaccine. Colonoscopy: never had. Will do yearly ifob. Never returned last year.   Lab Results  Component Value Date   PSA 6.28* 04/22/2014   PSA 5.03* 06/24/2013   PSA 5.93* 03/18/2013

## 2014-05-11 ENCOUNTER — Other Ambulatory Visit (INDEPENDENT_AMBULATORY_CARE_PROVIDER_SITE_OTHER): Payer: BC Managed Care – PPO

## 2014-05-11 DIAGNOSIS — Z1211 Encounter for screening for malignant neoplasm of colon: Secondary | ICD-10-CM

## 2014-05-11 LAB — FECAL OCCULT BLOOD, IMMUNOCHEMICAL: Fecal Occult Bld: NEGATIVE

## 2014-05-12 ENCOUNTER — Encounter: Payer: Self-pay | Admitting: *Deleted

## 2014-06-04 ENCOUNTER — Encounter: Payer: BC Managed Care – PPO | Admitting: Family Medicine

## 2014-06-19 ENCOUNTER — Other Ambulatory Visit (HOSPITAL_COMMUNITY): Payer: Self-pay | Admitting: Urology

## 2014-06-19 DIAGNOSIS — R972 Elevated prostate specific antigen [PSA]: Secondary | ICD-10-CM

## 2014-07-06 ENCOUNTER — Ambulatory Visit (HOSPITAL_COMMUNITY)
Admission: RE | Admit: 2014-07-06 | Discharge: 2014-07-06 | Disposition: A | Discharge status: Home / Self Care | Payer: BC Managed Care – PPO | Source: Ambulatory Visit | Attending: Urology | Admitting: Urology

## 2014-07-06 DIAGNOSIS — R972 Elevated prostate specific antigen [PSA]: Secondary | ICD-10-CM

## 2014-07-06 LAB — CREATININE, SERUM
CREATININE: 1 mg/dL (ref 0.50–1.35)
GFR calc Af Amer: >90 mL/min (ref >90)
GFR calc non Af Amer: 79 mL/min — ABNORMAL LOW (ref >90)

## 2014-07-06 MED ORDER — GADOBENATE DIMEGLUMINE 529 MG/ML IV SOLN
10.0000 mL | Freq: Once | INTRAVENOUS | Status: AC | PRN
  Administered 2014-07-06: 10 mL via INTRAVENOUS

## 2014-07-06 MED ORDER — GADOBENATE DIMEGLUMINE 529 MG/ML IV SOLN: 15.0000 mL | Freq: Once | INTRAVENOUS | Status: AC | PRN

## 2014-10-16 ENCOUNTER — Other Ambulatory Visit (INDEPENDENT_AMBULATORY_CARE_PROVIDER_SITE_OTHER): Payer: BLUE CROSS/BLUE SHIELD

## 2014-10-16 ENCOUNTER — Telehealth: Payer: Self-pay | Admitting: Family Medicine

## 2014-10-16 DIAGNOSIS — E7800 Pure hypercholesterolemia, unspecified: Secondary | ICD-10-CM

## 2014-10-16 LAB — COMPREHENSIVE METABOLIC PANEL
ALK PHOS: 63 U/L (ref 39–117)
ALT: 20 U/L (ref 0–53)
AST: 22 U/L (ref 0–37)
Albumin: 3.8 g/dL (ref 3.5–5.2)
BUN: 21 mg/dL (ref 6–23)
CALCIUM: 9.1 mg/dL (ref 8.4–10.5)
CHLORIDE: 104 meq/L (ref 96–112)
CO2: 30 mEq/L (ref 19–32)
CREATININE: 1.07 mg/dL (ref 0.40–1.50)
GFR: 74.3 mL/min (ref >60.00)
Glucose, Bld: 84 mg/dL (ref 70–99)
Potassium: 4.1 mEq/L (ref 3.5–5.1)
Sodium: 137 mEq/L (ref 135–145)
Total Bilirubin: 0.7 mg/dL (ref 0.2–1.2)
Total Protein: 7 g/dL (ref 6.0–8.3)

## 2014-10-16 LAB — LIPID PANEL
CHOLESTEROL: 232 mg/dL — AB (ref 0–200)
HDL: 51.7 mg/dL (ref >39.00)
LDL Cholesterol: 160 mg/dL — ABNORMAL HIGH (ref 0–99)
NONHDL: 180.3
Total CHOL/HDL Ratio: 4
Triglycerides: 100 mg/dL (ref 0.0–149.0)
VLDL: 20 mg/dL (ref 0.0–40.0)

## 2014-10-16 NOTE — Telephone Encounter (Signed)
-----   Message from Marchia Bond sent at 10/12/2014  4:22 PM EDT ----- Regarding: 6 mo f/u labs Fri 10/16/14 Please order  Future 6 mo f/u labs for pt's upcoming lab appt. 10/15 ov mentioned rechecking lipid. Pt is on chol med, do you want a hepatic pnl as well? Thanks Aniceto Boss

## 2014-10-23 ENCOUNTER — Ambulatory Visit (INDEPENDENT_AMBULATORY_CARE_PROVIDER_SITE_OTHER): Payer: BLUE CROSS/BLUE SHIELD | Admitting: Family Medicine

## 2014-10-23 ENCOUNTER — Encounter: Payer: Self-pay | Admitting: Family Medicine

## 2014-10-23 VITALS — BP 116/90 | HR 69 | Temp 98.7°F | Ht 61.5 in | Wt 113.5 lb

## 2014-10-23 DIAGNOSIS — E7800 Pure hypercholesterolemia, unspecified: Secondary | ICD-10-CM

## 2014-10-23 NOTE — Progress Notes (Signed)
   Subjective:    Patient ID: Carl Harris, male    DOB: [birth year], 64 y.o.   MRN: [id number]  HPI  64 year old male presents for follow up hyperlipidemia.   Elevated Cholesterol:  LDL not at goal < 130,  lipitor 40 mg daily. for a few week but then ran out. No SE associated. Lab Results  Component Value Date   CHOL 232* 10/16/2014   HDL 51.70 10/16/2014   LDLCALC 160* 10/16/2014   LDLDIRECT 228.5 06/24/2013   TRIG 100.0 10/16/2014   CHOLHDL 4 10/16/2014  Using medications without problems:None Muscle aches: None Diet compliance: Moderate diet, eating fish.  Exercise:Walking daily Other complaints:    Review of Systems  Constitutional: Negative for fatigue.  HENT: Negative for ear pain and nosebleeds.        Small amount of blood in left nare this AM,  Now resolved.  Eyes: Negative for pain.  Respiratory: Negative for cough and shortness of breath.   Cardiovascular: Negative for chest pain and leg swelling.  Gastrointestinal: Negative for diarrhea and constipation.  Genitourinary: Negative for dysuria.       Objective:   Physical Exam  Constitutional: Vital signs are normal. He appears well-developed and well-nourished.  HENT:  Head: Normocephalic.  Right Ear: Hearing normal.  Left Ear: Hearing normal.  Nose: Nose normal.  Mouth/Throat: Oropharynx is clear and moist and mucous membranes are normal.  Neck: Trachea normal. Carotid bruit is not present. No thyroid mass and no thyromegaly present.  Cardiovascular: Normal rate, regular rhythm and normal pulses.  Exam reveals no gallop, no distant heart sounds and no friction rub.   No murmur heard. No peripheral edema  Pulmonary/Chest: Effort normal and breath sounds normal. No respiratory distress.  Skin: Skin is warm, dry and intact. No rash noted.  Psychiatric: He has a normal mood and affect. His speech is normal and behavior is normal. Thought content normal.          Assessment & Plan:

## 2014-10-23 NOTE — Assessment & Plan Note (Signed)
No improvement with lifestyle change. Not taking med. No clear barriers, just forgot.  Will restart taking at least a few times a week.

## 2014-10-23 NOTE — Patient Instructions (Signed)
Try to take lipitor daily or at least a few days a week.

## 2014-10-23 NOTE — Progress Notes (Signed)
Pre visit review using our clinic review tool, if applicable. No additional management support is needed unless otherwise documented below in the visit note. 

## 2015-04-22 ENCOUNTER — Telehealth: Payer: Self-pay | Admitting: Family Medicine

## 2015-04-22 ENCOUNTER — Other Ambulatory Visit (INDEPENDENT_AMBULATORY_CARE_PROVIDER_SITE_OTHER): Payer: BLUE CROSS/BLUE SHIELD

## 2015-04-22 DIAGNOSIS — E7800 Pure hypercholesterolemia, unspecified: Secondary | ICD-10-CM | POA: Diagnosis not present

## 2015-04-22 DIAGNOSIS — R972 Elevated prostate specific antigen [PSA]: Secondary | ICD-10-CM

## 2015-04-22 LAB — COMPREHENSIVE METABOLIC PANEL
ALBUMIN: 4.1 g/dL (ref 3.5–5.2)
ALT: 17 U/L (ref 0–53)
AST: 19 U/L (ref 0–37)
Alkaline Phosphatase: 68 U/L (ref 39–117)
BUN: 20 mg/dL (ref 6–23)
CO2: 27 mEq/L (ref 19–32)
Calcium: 9.2 mg/dL (ref 8.4–10.5)
Chloride: 104 mEq/L (ref 96–112)
Creatinine, Ser: 1.09 mg/dL (ref 0.40–1.50)
GFR: 72.61 mL/min (ref >60.00)
Glucose, Bld: 87 mg/dL (ref 70–99)
POTASSIUM: 4.3 meq/L (ref 3.5–5.1)
SODIUM: 141 meq/L (ref 135–145)
Total Bilirubin: 0.8 mg/dL (ref 0.2–1.2)
Total Protein: 7.1 g/dL (ref 6.0–8.3)

## 2015-04-22 LAB — LIPID PANEL
CHOLESTEROL: 242 mg/dL — AB (ref 0–200)
HDL: 56.2 mg/dL (ref >39.00)
LDL CALC: 160 mg/dL — AB (ref 0–99)
NonHDL: 186.04
Total CHOL/HDL Ratio: 4
Triglycerides: 132 mg/dL (ref 0.0–149.0)
VLDL: 26.4 mg/dL (ref 0.0–40.0)

## 2015-04-22 NOTE — Telephone Encounter (Signed)
-----   Message from [name] sent at 04/15/2015 10:10 AM EDT ----- Regarding: Lab orders for Thursday, 10.6.16 Lab orders for a 6 month follow up appt

## 2015-04-23 LAB — PSA, TOTAL AND FREE
PSA, Free Pct: 24 % (ref >25)
PSA, Free: 1.53 ng/mL
PSA: 6.51 ng/mL — ABNORMAL HIGH (ref <=4.00)

## 2015-04-27 ENCOUNTER — Ambulatory Visit (INDEPENDENT_AMBULATORY_CARE_PROVIDER_SITE_OTHER): Payer: BLUE CROSS/BLUE SHIELD | Admitting: Family Medicine

## 2015-04-27 ENCOUNTER — Encounter: Payer: Self-pay | Admitting: Family Medicine

## 2015-04-27 ENCOUNTER — Other Ambulatory Visit: Payer: Self-pay | Admitting: Family Medicine

## 2015-04-27 VITALS — BP 148/95 | HR 73 | Temp 98.5°F | Ht 62.25 in | Wt 115.2 lb

## 2015-04-27 DIAGNOSIS — R030 Elevated blood-pressure reading, without diagnosis of hypertension: Secondary | ICD-10-CM | POA: Diagnosis not present

## 2015-04-27 DIAGNOSIS — N400 Enlarged prostate without lower urinary tract symptoms: Secondary | ICD-10-CM

## 2015-04-27 DIAGNOSIS — R972 Elevated prostate specific antigen [PSA]: Secondary | ICD-10-CM

## 2015-04-27 DIAGNOSIS — Z23 Encounter for immunization: Secondary | ICD-10-CM | POA: Diagnosis not present

## 2015-04-27 DIAGNOSIS — Z1211 Encounter for screening for malignant neoplasm of colon: Secondary | ICD-10-CM

## 2015-04-27 DIAGNOSIS — Z0000 Encounter for general adult medical examination without abnormal findings: Secondary | ICD-10-CM | POA: Diagnosis not present

## 2015-04-27 DIAGNOSIS — Z1159 Encounter for screening for other viral diseases: Secondary | ICD-10-CM

## 2015-04-27 MED ORDER — ATORVASTATIN CALCIUM 40 MG PO TABS: 40.0000 mg | ORAL_TABLET | Freq: Every day | ORAL | Status: DC

## 2015-04-27 NOTE — Progress Notes (Signed)
Pre visit review using our clinic review tool, if applicable. No additional management support is needed unless otherwise documented below in the visit note. 

## 2015-04-27 NOTE — Addendum Note (Signed)
Addended by: Daralene Milch C on: 04/27/2015 11:50 AM   Modules accepted: SmartSet

## 2015-04-27 NOTE — Patient Instructions (Addendum)
Check BP at home, call with measurements in 1-2 weeks. Goal BP < 140/90.  Work on low cholesterol diet and regular exercise. Try to take atorvastatin three times week. Look into shingles vaccine coverage.  Stop at lab on way out for IFOB and hep C test.

## 2015-04-27 NOTE — Progress Notes (Signed)
Subjective:    Patient ID: Carl Harris, male    DOB: 06-21-1951, 64 y.o.   MRN: [id number]  HPI  64 year old male present for wellness exam.  Elevated Cholesterol: he has been using rarely atorvastatin 40 mg , LDL not at goal < 130. Lab Results  Component Value Date   CHOL 242* 04/22/2015   HDL 56.20 04/22/2015   LDLCALC 160* 04/22/2015   LDLDIRECT 228.5 06/24/2013   TRIG 132.0 04/22/2015   CHOLHDL 4 04/22/2015  Using medications without problems:None Muscle aches: none Diet compliance: Exercise: walking, running  Other complaints:   BP elevation today. No history of HTN. BP Readings from Last 3 Encounters:  04/27/15 144/93  10/23/14 116/90  04/23/14 110/70      Review of Systems  Constitutional: Negative for fever, fatigue and unexpected weight change.  HENT: Negative for congestion, ear pain, postnasal drip, rhinorrhea, sore throat and trouble swallowing.   Eyes: Negative for pain.  Respiratory: Negative for cough, shortness of breath and wheezing.   Cardiovascular: Negative for chest pain, palpitations and leg swelling.  Gastrointestinal: Negative for nausea, abdominal pain, diarrhea, constipation and blood in stool.  Genitourinary: Negative for dysuria, urgency, hematuria, discharge, penile swelling, scrotal swelling, difficulty urinating, penile pain and testicular pain.  Skin: Negative for rash.  Neurological: Negative for syncope, weakness, light-headedness, numbness and headaches.  Psychiatric/Behavioral: Negative for behavioral problems and dysphoric mood. The patient is not nervous/anxious.        Objective:   Physical Exam  Constitutional: He appears well-developed and well-nourished.  Non-toxic appearance. He does not appear ill. No distress.  HENT:  Head: Normocephalic and atraumatic.  Right Ear: Hearing, tympanic membrane, external ear and ear canal normal.  Left Ear: Hearing, tympanic membrane, external ear and ear canal normal.  Nose: Nose  normal.  Mouth/Throat: Uvula is midline, oropharynx is clear and moist and mucous membranes are normal.  Eyes: Conjunctivae, EOM and lids are normal. Pupils are equal, round, and reactive to light. Lids are everted and swept, no foreign bodies found.  Neck: Trachea normal, normal range of motion and phonation normal. Neck supple. Carotid bruit is not present. No thyroid mass and no thyromegaly present.  Cardiovascular: Normal rate, regular rhythm, S1 normal, S2 normal, intact distal pulses and normal pulses.  Exam reveals no gallop.   No murmur heard. Pulmonary/Chest: Breath sounds normal. He has no wheezes. He has no rhonchi. He has no rales.  Abdominal: Soft. Normal appearance and bowel sounds are normal. There is no hepatosplenomegaly. There is no tenderness. There is no rebound, no guarding and no CVA tenderness. No hernia. Hernia confirmed negative in the right inguinal area and confirmed negative in the left inguinal area.  Genitourinary: Testes normal and penis normal. Rectal exam shows no external hemorrhoid, no internal hemorrhoid, no fissure, no mass, no tenderness and anal tone normal. Guaiac negative stool. Prostate is enlarged. Prostate is not tender. Right testis shows no mass and no tenderness. Left testis shows no mass and no tenderness. No paraphimosis or penile tenderness.  Uniform enlargement  Lymphadenopathy:    He has no cervical adenopathy.       Right: No inguinal adenopathy present.       Left: No inguinal adenopathy present.  Neurological: He is alert. He has normal strength and normal reflexes. No cranial nerve deficit or sensory deficit. Gait normal.  Skin: Skin is warm, dry and intact. No rash noted.  Psychiatric: He has a normal mood and affect. His  speech is normal and behavior is normal. Judgment normal.          Assessment & Plan:  The patient's preventative maintenance and recommended screening tests for an annual wellness exam were reviewed in full  today. Brought up to date unless services declined.  Counselled on the importance of diet, exercise, and its role in overall health and mortality. The patient's FH and SH was reviewed, including their home life, tobacco status, and drug and alcohol status.   Vaccines: Uptodate with TDap, due for shingles but refused. Given flu   Elevated PSA, free percent in nml range. Hx of BPH Seen Dr. Risa Grill 06/2014 MRI prostate ; No MR findings to suggest macroscopic prostate tumor. Enlargement of the central gland which indents the base of the bladder, suggesting BPH. Lab Results  Component Value Date   PSA 6.51* 04/22/2015   PSA 6.28* 04/22/2014   PSA 5.03* 06/24/2013  Nonsmoker. Hep C :  Will do today.   Refused HIV. Colon cancer screen:  ifob neg in 2014 , 2015, due now

## 2015-04-27 NOTE — Addendum Note (Signed)
Addended by: Carter Kitten on: 04/27/2015 10:34 AM   Modules accepted: Orders

## 2015-04-27 NOTE — Assessment & Plan Note (Signed)
No current urinary issues. No nocturia.

## 2015-04-27 NOTE — Assessment & Plan Note (Signed)
Nmlly wellcontrolled. Pt rushed here.  Recheck and follow at home. Call if > 140/90and with measurements in 2 weeks.

## 2015-04-28 LAB — HEPATITIS C ANTIBODY: HCV AB: NEGATIVE

## 2015-04-29 ENCOUNTER — Encounter: Payer: Self-pay | Admitting: *Deleted

## 2016-11-03 ENCOUNTER — Ambulatory Visit (INDEPENDENT_AMBULATORY_CARE_PROVIDER_SITE_OTHER): Payer: 59 | Admitting: Family Medicine

## 2016-11-03 ENCOUNTER — Encounter: Payer: Self-pay | Admitting: Family Medicine

## 2016-11-03 ENCOUNTER — Other Ambulatory Visit: Payer: 59

## 2016-11-03 VITALS — BP 139/89 | HR 72 | Temp 98.9°F | Ht 62.0 in | Wt 113.8 lb

## 2016-11-03 DIAGNOSIS — E7800 Pure hypercholesterolemia, unspecified: Secondary | ICD-10-CM

## 2016-11-03 DIAGNOSIS — R972 Elevated prostate specific antigen [PSA]: Secondary | ICD-10-CM | POA: Diagnosis not present

## 2016-11-03 DIAGNOSIS — Z0000 Encounter for general adult medical examination without abnormal findings: Secondary | ICD-10-CM

## 2016-11-03 DIAGNOSIS — N401 Enlarged prostate with lower urinary tract symptoms: Secondary | ICD-10-CM | POA: Diagnosis not present

## 2016-11-03 DIAGNOSIS — R030 Elevated blood-pressure reading, without diagnosis of hypertension: Secondary | ICD-10-CM

## 2016-11-03 DIAGNOSIS — Z1211 Encounter for screening for malignant neoplasm of colon: Secondary | ICD-10-CM

## 2016-11-03 DIAGNOSIS — R350 Frequency of micturition: Secondary | ICD-10-CM

## 2016-11-03 LAB — CBC WITH DIFFERENTIAL/PLATELET
BASOS PCT: 0.5 % (ref 0.0–3.0)
Basophils Absolute: 0 10*3/uL (ref 0.0–0.1)
Eosinophils Absolute: 0 10*3/uL (ref 0.0–0.7)
Eosinophils Relative: 0.7 % (ref 0.0–5.0)
HCT: 41.3 % (ref 39.0–52.0)
Hemoglobin: 13.5 g/dL (ref 13.0–17.0)
LYMPHS ABS: 1.2 10*3/uL (ref 0.7–4.0)
Lymphocytes Relative: 22.9 % (ref 12.0–46.0)
MCHC: 32.7 g/dL (ref 30.0–36.0)
MCV: 89 fl (ref 78.0–100.0)
MONOS PCT: 8.8 % (ref 3.0–12.0)
Monocytes Absolute: 0.5 10*3/uL (ref 0.1–1.0)
NEUTROS ABS: 3.7 10*3/uL (ref 1.4–7.7)
NEUTROS PCT: 67.1 % (ref 43.0–77.0)
PLATELETS: 253 10*3/uL (ref 150.0–400.0)
RBC: 4.65 Mil/uL (ref 4.22–5.81)
RDW: 13.7 % (ref 11.5–15.5)
WBC: 5.5 10*3/uL (ref 4.0–10.5)

## 2016-11-03 LAB — COMPREHENSIVE METABOLIC PANEL
ALT: 17 U/L (ref 0–53)
AST: 21 U/L (ref 0–37)
Albumin: 4.1 g/dL (ref 3.5–5.2)
Alkaline Phosphatase: 65 U/L (ref 39–117)
BUN: 25 mg/dL — AB (ref 6–23)
CHLORIDE: 106 meq/L (ref 96–112)
CO2: 27 meq/L (ref 19–32)
CREATININE: 1.04 mg/dL (ref 0.40–1.50)
Calcium: 9.1 mg/dL (ref 8.4–10.5)
GFR: 76.28 mL/min (ref >60.00)
GLUCOSE: 97 mg/dL (ref 70–99)
POTASSIUM: 4.4 meq/L (ref 3.5–5.1)
SODIUM: 140 meq/L (ref 135–145)
Total Bilirubin: 0.8 mg/dL (ref 0.2–1.2)
Total Protein: 6.9 g/dL (ref 6.0–8.3)

## 2016-11-03 LAB — T3, FREE: T3, Free: 3.7 pg/mL (ref 2.3–4.2)

## 2016-11-03 LAB — LIPID PANEL
Cholesterol: 238 mg/dL — ABNORMAL HIGH (ref 0–200)
HDL: 60.7 mg/dL (ref >39.00)
LDL CALC: 163 mg/dL — AB (ref 0–99)
NONHDL: 177.75
Total CHOL/HDL Ratio: 4
Triglycerides: 76 mg/dL (ref 0.0–149.0)
VLDL: 15.2 mg/dL (ref 0.0–40.0)

## 2016-11-03 LAB — T4, FREE: Free T4: 0.74 ng/dL (ref 0.60–1.60)

## 2016-11-03 LAB — PSA: PSA: 9.58 ng/mL — ABNORMAL HIGH (ref 0.10–4.00)

## 2016-11-03 LAB — TSH: TSH: 2.96 u[IU]/mL (ref 0.35–4.50)

## 2016-11-03 NOTE — Patient Instructions (Addendum)
We will call with lab results.  Check blood pressure daily.. Call with measurements in next 1-2 weeks.  Keep up with healthy eating and regular exercise.  Look into shingrix vaccine coverage.. Call if interested in getting.  Stop by the lab on way out to pick up stool test.

## 2016-11-03 NOTE — Progress Notes (Signed)
Pre visit review using our clinic review tool, if applicable. No additional management support is needed unless otherwise documented below in the visit note. 

## 2016-11-03 NOTE — Progress Notes (Signed)
   Subjective:    Patient ID: [name], male    DOB: 1950-11-12, 66 y.o.   MRN: [id number]  HPI  66 year old male presents for wellness visit.   He recently retired. He is enjoying it.  Elevated Cholesterol:  Due for re-eval. Not taking atorvastatin regularly, using about every 2-3 days. Lab Results  Component Value Date   CHOL 242 (H) 04/22/2015   HDL 56.20 04/22/2015   LDLCALC 160 (H) 04/22/2015   LDLDIRECT 228.5 06/24/2013   TRIG 132.0 04/22/2015   CHOLHDL 4 04/22/2015  Using medications without problems: none Muscle aches: none Diet compliance: good Exercise: walking daily Other complaints: Occ dizzy When dizzy 140-160/90-95 BPs Here since exercising blood pressure   he is interested in having BP med availbale to use as needed when BP is high.  Social History /Family History/Past Medical History reviewed in detail and updated in EMR if needed. Blood pressure 139/89, pulse 72, temperature 98.9 F (37.2 C), temperature source Oral, height 5\' 2"  (1.575 m), weight 113 lb 12 oz (51.6 kg).  Review of Systems  Constitutional: Negative for fatigue and fever.  HENT: Negative for ear pain.   Eyes: Negative for pain.  Respiratory: Negative for cough and shortness of breath.   Cardiovascular: Negative for chest pain, palpitations and leg swelling.       Chest wall strain on right few weeks ago.Gaylyn Cheers now  Gastrointestinal: Negative for abdominal pain.  Genitourinary: Negative for dysuria.  Musculoskeletal: Negative for arthralgias.  Neurological: Negative for syncope, light-headedness and headaches.  Psychiatric/Behavioral: Negative for dysphoric mood.       Objective:   Physical Exam        Assessment & Plan:  The patient's preventative maintenance and recommended screening tests for an annual wellness exam were reviewed in full today. Brought up to date unless services declined.  Counselled on the importance of diet, exercise, and its role in overall health  and mortality. The patient's FH and SH was reviewed, including their home life, tobacco status, and drug and alcohol status.   Vaccines: Uptodate with TDap, due for shingles but refused.   Elevated PSA, free percent in nml range. Hx of BPH Seen Dr. Risa Grill 06/2014  Due for re-eval now. MRI prostate ; No MR findings to suggest macroscopic prostate tumor. Enlargement of the central gland which indents the base of the bladder, suggesting BPH. Recent Labs       Lab Results  Component Value Date   PSA 6.51* 04/22/2015   PSA 6.28* 04/22/2014   PSA 5.03* 06/24/2013    Nonsmoker. Hep C :  done  Refused HIV. Colon cancer screen:  ifob neg in 2014 , 2015, 2016 due now

## 2016-11-07 ENCOUNTER — Encounter: Payer: Self-pay | Admitting: *Deleted

## 2016-11-07 ENCOUNTER — Telehealth: Payer: Self-pay | Admitting: Family Medicine

## 2016-11-07 NOTE — Telephone Encounter (Signed)
Let pt know cholesterol is still high.. He is at risk for CVD.  Make sure try to take cholesterol medication EVERY DAY or at least 3 times a week. Follow up for recheck in 3 months.  prostate test is abnormal and ahas increase since last year.  Please have him return for recheck of PSA for total and free levels.Faythe Ghee to do in 3 months as well.. OR pt can return to follow up with [facility] instead. Thyroid and CMET nml.

## 2016-11-07 NOTE — Telephone Encounter (Signed)
Letter with lab results and recommendations/follow up mailed to Mr. Gerstner.

## 2016-11-09 ENCOUNTER — Encounter: Payer: Self-pay | Admitting: *Deleted

## 2016-11-09 ENCOUNTER — Other Ambulatory Visit (INDEPENDENT_AMBULATORY_CARE_PROVIDER_SITE_OTHER): Payer: 59

## 2016-11-09 DIAGNOSIS — Z1211 Encounter for screening for malignant neoplasm of colon: Secondary | ICD-10-CM

## 2016-11-09 LAB — FECAL OCCULT BLOOD, IMMUNOCHEMICAL: FECAL OCCULT BLD: NEGATIVE

## 2016-11-22 ENCOUNTER — Telehealth: Payer: Self-pay | Admitting: Family Medicine

## 2016-11-22 NOTE — Telephone Encounter (Signed)
[name] notified by telephone that Dr. Diona Browner reviewed his BP readings and for the most part his BP's at home are at goal.  No need for medication at this time.  Advised to continue following his BP's at home and to call us if his reading are >140/90 consistently. Patient states understanding.

## 2016-11-22 NOTE — Telephone Encounter (Signed)
Pt stopped in the office today to check on 2 missed calls he received. Pt said he dropped off BP readings over a wk ago and is requesting a cb on the cell number to discuss his treatment plan.

## 2016-12-05 NOTE — Assessment & Plan Note (Signed)
Due for re-eval. 

## 2016-12-05 NOTE — Assessment & Plan Note (Signed)
Follow BP at home. We may consider prn BP med if very variable.

## 2017-07-17 DIAGNOSIS — K921 Melena: Secondary | ICD-10-CM

## 2017-07-17 HISTORY — DX: Melena: K92.1

## 2017-07-24 ENCOUNTER — Ambulatory Visit: Payer: 59 | Admitting: Family Medicine

## 2017-08-28 ENCOUNTER — Other Ambulatory Visit (INDEPENDENT_AMBULATORY_CARE_PROVIDER_SITE_OTHER): Payer: Medicare Other

## 2017-08-28 ENCOUNTER — Telehealth: Payer: Self-pay | Admitting: Family Medicine

## 2017-08-28 DIAGNOSIS — E7800 Pure hypercholesterolemia, unspecified: Secondary | ICD-10-CM | POA: Diagnosis not present

## 2017-08-28 DIAGNOSIS — R972 Elevated prostate specific antigen [PSA]: Secondary | ICD-10-CM

## 2017-08-28 LAB — LIPID PANEL
CHOL/HDL RATIO: 4
CHOLESTEROL: 240 mg/dL — AB (ref 0–200)
HDL: 53.9 mg/dL (ref >39.00)
LDL CALC: 147 mg/dL — AB (ref 0–99)
NONHDL: 186.55
TRIGLYCERIDES: 200 mg/dL — AB (ref 0.0–149.0)
VLDL: 40 mg/dL (ref 0.0–40.0)

## 2017-08-28 LAB — COMPREHENSIVE METABOLIC PANEL
ALT: 18 U/L (ref 0–53)
AST: 21 U/L (ref 0–37)
Albumin: 4 g/dL (ref 3.5–5.2)
Alkaline Phosphatase: 70 U/L (ref 39–117)
BILIRUBIN TOTAL: 0.6 mg/dL (ref 0.2–1.2)
BUN: 19 mg/dL (ref 6–23)
CALCIUM: 9.2 mg/dL (ref 8.4–10.5)
CO2: 28 mEq/L (ref 19–32)
CREATININE: 1.12 mg/dL (ref 0.40–1.50)
Chloride: 103 mEq/L (ref 96–112)
GFR: 69.85 mL/min (ref >60.00)
Glucose, Bld: 96 mg/dL (ref 70–99)
Potassium: 4.5 mEq/L (ref 3.5–5.1)
Sodium: 139 mEq/L (ref 135–145)
TOTAL PROTEIN: 6.9 g/dL (ref 6.0–8.3)

## 2017-08-28 LAB — PSA, MEDICARE: PSA: 9.32 ng/ml — ABNORMAL HIGH (ref 0.10–4.00)

## 2017-08-28 NOTE — Telephone Encounter (Signed)
-----   Message from [name] sent at 08/23/2017  3:07 PM EST ----- Regarding: Lab orders for Tuesdaym 2.12.19 Patient is scheduled for CPX labs, please order future labs, Thanks , Karna Christmas

## 2017-09-04 ENCOUNTER — Encounter: Payer: Self-pay | Admitting: Family Medicine

## 2017-09-04 ENCOUNTER — Ambulatory Visit (INDEPENDENT_AMBULATORY_CARE_PROVIDER_SITE_OTHER): Payer: Medicare Other | Admitting: Family Medicine

## 2017-09-04 ENCOUNTER — Other Ambulatory Visit: Payer: Self-pay

## 2017-09-04 VITALS — BP 130/80 | HR 73 | Temp 98.6°F | Ht 62.0 in | Wt 120.0 lb

## 2017-09-04 DIAGNOSIS — N401 Enlarged prostate with lower urinary tract symptoms: Secondary | ICD-10-CM

## 2017-09-04 DIAGNOSIS — R350 Frequency of micturition: Secondary | ICD-10-CM

## 2017-09-04 DIAGNOSIS — Z0000 Encounter for general adult medical examination without abnormal findings: Secondary | ICD-10-CM

## 2017-09-04 DIAGNOSIS — R972 Elevated prostate specific antigen [PSA]: Secondary | ICD-10-CM | POA: Diagnosis not present

## 2017-09-04 DIAGNOSIS — K921 Melena: Secondary | ICD-10-CM

## 2017-09-04 DIAGNOSIS — Z23 Encounter for immunization: Secondary | ICD-10-CM

## 2017-09-04 DIAGNOSIS — E7800 Pure hypercholesterolemia, unspecified: Secondary | ICD-10-CM | POA: Diagnosis not present

## 2017-09-04 MED ORDER — ATORVASTATIN CALCIUM 40 MG PO TABS: 40.0000 mg | ORAL_TABLET | Freq: Every day | ORAL | 11 refills | Status: DC

## 2017-09-04 NOTE — Progress Notes (Signed)
Subjective:    Patient ID: Carl Harris, male    DOB: [birth year], 67 y.o.   MRN: [id number]  HPI  67 year old male presents for welcome to medicare visit.   He has noted some blood in toilet after BM in last few months, occ has to strain for BMs. Constipation intermittently, but daily BM.   He continues to have  Urinary frequency,  Mild nocturia and has to push to get urine out in AM.  Elevated Cholesterol:  Stopped atorvastain. med given he was feeling better.  Lab Results  Component Value Date   CHOL 240 (H) 08/28/2017   HDL 53.90 08/28/2017   LDLCALC 147 (H) 08/28/2017   LDLDIRECT 228.5 06/24/2013   TRIG 200.0 (H) 08/28/2017   CHOLHDL 4 08/28/2017  Using medications without problems: none Muscle aches: none Diet compliance: good, but fat intake has increased some Exercise: walking daily, 30 min    Advance directives and end of life planning reviewed in detail with patient and documented in EMR. Patient given handout on advance care directives if needed. HCPOA and living will updated if needed.  Hearing Screening   Method: Audiometry   125Hz  250Hz  500Hz  1000Hz  2000Hz  3000Hz  4000Hz  6000Hz  8000Hz   Right ear:   20 20 20  20     Left ear:   20 20 20  20       Visual Acuity Screening   Right eye Left eye Both eyes  Without correction: 20/70 20/70 20/70   With correction:     Comments: Test done without his glasses  Fall Risk  09/04/2017  Falls in the past year? No   Depression screen [facility] 2/9 09/04/2017 10/23/2014 10/23/2014  Decreased Interest 0 2 2  Down, Depressed, Hopeless 0 1 1  PHQ - 2 Score 0 3 3  Altered sleeping - 0 -  Tired, decreased energy - 1 -  Change in appetite - 0 -  Feeling bad or failure about yourself  - 1 -  Trouble concentrating - 0 -  Moving slowly or fidgety/restless - 0 -  Suicidal thoughts - 0 -  PHQ-9 Score - 5 -  Difficult doing work/chores - Somewhat difficult -     Social History /Family History/Past Medical History reviewed in detail and  updated in EMR if needed. Blood pressure 130/80, pulse 73, temperature 98.6 F (37 C), temperature source Oral, height 5\' 2"  (1.575 m), weight 120 lb (54.4 kg).  Review of Systems  Constitutional: Negative for fatigue and fever.  HENT: Negative for ear pain.   Eyes: Negative for pain.  Respiratory: Negative for cough and shortness of breath.   Cardiovascular: Negative for chest pain, palpitations and leg swelling.  Gastrointestinal: Negative for abdominal pain.        mild right lower abd pain , last 2 hours  Genitourinary: Negative for dysuria.  Musculoskeletal: Negative for arthralgias.  Neurological: Negative for syncope, light-headedness and headaches.  Psychiatric/Behavioral: Negative for dysphoric mood.       Objective:   Physical Exam  Constitutional: He appears well-developed and well-nourished.  Non-toxic appearance. He does not appear ill. No distress.  HENT:  Head: Normocephalic and atraumatic.  Right Ear: Hearing, tympanic membrane, external ear and ear canal normal.  Left Ear: Hearing, tympanic membrane, external ear and ear canal normal.  Nose: Nose normal.  Mouth/Throat: Uvula is midline, oropharynx is clear and moist and mucous membranes are normal.  Eyes: Conjunctivae, EOM and lids are normal. Pupils are equal, round, and reactive to  light. Lids are everted and swept, no foreign bodies found.  Neck: Trachea normal, normal range of motion and phonation normal. Neck supple. Carotid bruit is not present. No thyroid mass and no thyromegaly present.  Cardiovascular: Normal rate, regular rhythm, S1 normal, S2 normal, intact distal pulses and normal pulses. Exam reveals no gallop.  No murmur heard. Pulmonary/Chest: Breath sounds normal. He has no wheezes. He has no rhonchi. He has no rales.  Abdominal: Soft. Normal appearance and bowel sounds are normal. There is no hepatosplenomegaly. There is no tenderness. There is no rebound, no guarding and no CVA tenderness. No  hernia.  Genitourinary: Rectal exam shows no external hemorrhoid, no internal hemorrhoid, no fissure, no tenderness and guaiac negative stool. Prostate is enlarged. Prostate is not tender.  Lymphadenopathy:    He has no cervical adenopathy.  Neurological: He is alert. He has normal strength and normal reflexes. No cranial nerve deficit or sensory deficit. Gait normal.  Skin: Skin is warm, dry and intact. No rash noted.  Psychiatric: He has a normal mood and affect. His speech is normal and behavior is normal. Judgment normal.          Assessment & Plan:  The patient's preventative maintenance and recommended screening tests for an annual wellness exam were reviewed in full today. Brought up to date unless services declined.  Counselled on the importance of diet, exercise, and its role in overall health and mortality. The patient's FH and SH was reviewed, including their home life, tobacco status, and drug and alcohol status.   Vaccines: Uptodate with TDap, due for shingles but refused. Due for PNA series and flu.  Elevated PSA, free percent in nml range. Hx of BPH Seen Dr. Risa Grill 06/2014. MRI prostate ; No MR findings to suggest macroscopic prostate tumor. Enlargement of the central gland which indents the base of the bladder, suggesting BPH. Lab Results  Component Value Date   PSA 9.32 (H) 08/28/2017   PSA 9.58 (H) 11/03/2016   PSA 6.51 (H) 04/22/2015       Nonsmoker. Hep C :  done Refused HIV. Colon cancer screen: ifob neg in 2014 , 2015, 2016, 2018, consider cologuard this year

## 2017-09-04 NOTE — Patient Instructions (Addendum)
Restart atorvastain and stay on it. Keep up with exercise as you are, work on low fat low cholesterol diet.  Stop at front desk for referral back to Dr. Risa Grill and to GI for colonoscopy to evaluated blood in stool.

## 2017-09-07 DIAGNOSIS — N401 Enlarged prostate with lower urinary tract symptoms: Secondary | ICD-10-CM | POA: Diagnosis not present

## 2017-09-07 DIAGNOSIS — R3912 Poor urinary stream: Secondary | ICD-10-CM | POA: Diagnosis not present

## 2017-09-07 DIAGNOSIS — R972 Elevated prostate specific antigen [PSA]: Secondary | ICD-10-CM | POA: Diagnosis not present

## 2017-09-07 DIAGNOSIS — N528 Other male erectile dysfunction: Secondary | ICD-10-CM | POA: Diagnosis not present

## 2017-09-07 DIAGNOSIS — R350 Frequency of micturition: Secondary | ICD-10-CM | POA: Diagnosis not present

## 2017-09-14 ENCOUNTER — Ambulatory Visit (INDEPENDENT_AMBULATORY_CARE_PROVIDER_SITE_OTHER): Payer: Medicare Other | Admitting: Physician Assistant

## 2017-09-14 ENCOUNTER — Encounter: Payer: Self-pay | Admitting: Physician Assistant

## 2017-09-14 VITALS — BP 124/72 | HR 78 | Ht 62.0 in | Wt 119.0 lb

## 2017-09-14 DIAGNOSIS — K921 Melena: Secondary | ICD-10-CM

## 2017-09-14 MED ORDER — NA SULFATE-K SULFATE-MG SULF 17.5-3.13-1.6 GM/177ML PO SOLN: 1.0000 | ORAL | 0 refills | Status: DC

## 2017-09-14 NOTE — Progress Notes (Signed)
Agree with assessment and plan as outlined.  

## 2017-09-14 NOTE — Patient Instructions (Signed)

## 2017-09-14 NOTE — Progress Notes (Signed)
Chief Complaint: Hematochezia  HPI:    Carl Harris is 67 y/o [place] male, who was referred to me by Jinny Sanders, MD for a complaint of hematochezia .      Today, explains that for at least the past year he has seen some bright red blood off and on in his stool when he strains to have a bowel movement which is only occasionally, but over the past 2-3 months he has seen bright red blood at least 2-3 times a month which is increased in frequency.  Describes that sometimes this "fills the toilet bowl".  Does describe some constipation but tells me that he tries to eat a lot of fiber to help this.  He does not drink any water.  Denies abdominal or rectal pain.    Denies fever, chills, weight loss, anorexia, nausea, vomiting, heartburn, reflux or symptoms that awaken him at night.  Past Medical/ Past Surgical History:  Procedure Laterality Date  . epidural injections  2009    Current Outpatient Medications  Medication Sig Dispense Refill  . aspirin 81 MG tablet Take 1 tablet (81 mg total) by mouth daily. (Patient not taking: Reported on 09/14/2017) 90 tablet 3  . atorvastatin (LIPITOR) 40 MG tablet Take 1 tablet (40 mg total) by mouth at bedtime. (Patient not taking: Reported on 09/14/2017) 30 tablet 11   No current facility-administered medications for this visit.     Allergies as of 09/14/2017  . (No Known Allergies)    Family History  Problem Relation Age of Onset  . Lung cancer Mother   . Coronary artery disease Father   . Heart attack Father 63    Social History   Socioeconomic History  . Marital status: Married    Spouse name: Not on file  . Number of children: Not on file  . Years of education: Not on file  . Highest education level: Not on file  Social Needs  . Financial resource strain: Not on file  . Food insecurity - worry: Not on file  . Food insecurity - inability: Not on file  . Transportation needs - medical: Not on file  . Transportation needs - non-medical: Not  on file  Occupational History  . Not on file  Tobacco Use  . Smoking status: Never Smoker  . Smokeless tobacco: Never Used  Substance and Sexual Activity  . Alcohol use: No  . Drug use: No  . Sexual activity: Not on file  Other Topics Concern  . Not on file  Social History Narrative   Occupation: Merchant navy officer. Married. Diet: fruit and veggies.     Review of Systems:    Constitutional: No weight loss, fever or chills Skin: No rash  Cardiovascular: No chest pain  Respiratory: No SOB Gastrointestinal: See HPI and otherwise negative Genitourinary: No dysuria Neurological: No headache, dizziness or syncope Musculoskeletal: No new muscle or joint pain Hematologic: No bruising Psychiatric: No history of depression or anxiety   Physical Exam:  Vital signs: BP 124/72   Pulse 78   Ht 5\' 2"  (1.575 m)   Wt 119 lb (54 kg)   BMI 21.77 kg/m   Constitutional:   Pleasant [place] male appears to be in NAD, Well developed, Well nourished, alert and cooperative Head:  Normocephalic and atraumatic. Eyes:   PEERL, EOMI. No icterus. Conjunctiva pink. Ears:  Normal auditory acuity. Neck:  Supple Throat: Oral cavity and pharynx without inflammation, swelling or lesion.  Respiratory: Respirations even and unlabored. Lungs clear to  auscultation bilaterally.   No wheezes, crackles, or rhonchi.  Cardiovascular: Normal S1, S2. No MRG. Regular rate and rhythm. No peripheral edema, cyanosis or pallor.  Gastrointestinal:  Soft, nondistended, nontender. No rebound or guarding. Normal bowel sounds. No appreciable masses or hepatomegaly. Rectal:  Not performed.  Msk:  Symmetrical without gross deformities. Without edema, no deformity or joint abnormality.  Neurologic:  Alert and  oriented x4;  grossly normal neurologically.  Skin:   Dry and intact without significant lesions or rashes. Psychiatric:  Demonstrates good judgement and reason without abnormal affect or behaviors.  MOST RECENT LABS AND  IMAGING: CBC    Component Value Date/Time   WBC 5.5 11/03/2016 1236   RBC 4.65 11/03/2016 1236   HGB 13.5 11/03/2016 1236   HCT 41.3 11/03/2016 1236   PLT 253.0 11/03/2016 1236   MCV 89.0 11/03/2016 1236   MCHC 32.7 11/03/2016 1236   RDW 13.7 11/03/2016 1236   LYMPHSABS 1.2 11/03/2016 1236   MONOABS 0.5 11/03/2016 1236   EOSABS 0.0 11/03/2016 1236   BASOSABS 0.0 11/03/2016 1236    CMP     Component Value Date/Time   NA 139 08/28/2017 0741   K 4.5 08/28/2017 0741   CL 103 08/28/2017 0741   CO2 28 08/28/2017 0741   GLUCOSE 96 08/28/2017 0741   BUN 19 08/28/2017 0741   CREATININE 1.12 08/28/2017 0741   CALCIUM 9.2 08/28/2017 0741   PROT 6.9 08/28/2017 0741   ALBUMIN 4.0 08/28/2017 0741   AST 21 08/28/2017 0741   ALT 18 08/28/2017 0741   ALKPHOS 70 08/28/2017 0741   BILITOT 0.6 08/28/2017 0741   GFRNONAA 79 (L) 07/06/2014 0857   GFRAA >90 07/06/2014 0857    Assessment: 1.  Hematochezia: For the past year or so off and on, more frequent over the past couple of months, tends to be worse with straining, no prior colonoscopy; consider hemorrhoids most likely versus polyp versus mass versus other  Plan: 1.  Scheduled patient for a diagnostic colonoscopy due to history of hematochezia.  He has never had a colonoscopy before.  Discussed risk, benefits, limitations and alternatives and the patient agrees to proceed.  This was scheduled with Dr. Havery Moros in the [facility] 2.  Encouraged patient to increase fiber in his diet but also to increase water to at least 6-8 eight ounce glasses of water per day. 3.  Patient to follow in clinic per recommendations from Dr. Havery Moros after time of procedure.  Carl Newer, PA-C [facility] 09/14/2017, 2:16 PM  Cc: Jinny Sanders, MD

## 2017-10-02 DIAGNOSIS — K921 Melena: Secondary | ICD-10-CM | POA: Insufficient documentation

## 2017-10-02 NOTE — Assessment & Plan Note (Signed)
Refer back to Dr. Risa Grill for reassessment given continue symptoms.

## 2017-10-02 NOTE — Assessment & Plan Note (Signed)
Refer to GI 

## 2017-10-02 NOTE — Assessment & Plan Note (Signed)
Restart statin medication.

## 2017-10-08 ENCOUNTER — Encounter: Payer: Self-pay | Admitting: Family Medicine

## 2017-10-08 DIAGNOSIS — R972 Elevated prostate specific antigen [PSA]: Secondary | ICD-10-CM | POA: Diagnosis not present

## 2017-10-15 DIAGNOSIS — Z86010 Personal history of colonic polyps: Secondary | ICD-10-CM

## 2017-10-15 HISTORY — DX: Personal history of colonic polyps: Z86.010

## 2017-10-22 DIAGNOSIS — R3912 Poor urinary stream: Secondary | ICD-10-CM | POA: Diagnosis not present

## 2017-10-22 DIAGNOSIS — N401 Enlarged prostate with lower urinary tract symptoms: Secondary | ICD-10-CM | POA: Diagnosis not present

## 2017-10-22 DIAGNOSIS — R972 Elevated prostate specific antigen [PSA]: Secondary | ICD-10-CM | POA: Diagnosis not present

## 2017-10-22 DIAGNOSIS — R350 Frequency of micturition: Secondary | ICD-10-CM | POA: Diagnosis not present

## 2017-11-07 ENCOUNTER — Other Ambulatory Visit: Payer: Self-pay

## 2017-11-07 ENCOUNTER — Ambulatory Visit (AMBULATORY_SURGERY_CENTER): Payer: Medicare Other | Admitting: Gastroenterology

## 2017-11-07 ENCOUNTER — Encounter: Payer: Self-pay | Admitting: Gastroenterology

## 2017-11-07 VITALS — BP 90/56 | HR 79 | Temp 99.8°F | Resp 12 | Ht 62.0 in | Wt 119.0 lb

## 2017-11-07 DIAGNOSIS — D123 Benign neoplasm of transverse colon: Secondary | ICD-10-CM

## 2017-11-07 DIAGNOSIS — D120 Benign neoplasm of cecum: Secondary | ICD-10-CM

## 2017-11-07 DIAGNOSIS — D124 Benign neoplasm of descending colon: Secondary | ICD-10-CM

## 2017-11-07 DIAGNOSIS — D122 Benign neoplasm of ascending colon: Secondary | ICD-10-CM

## 2017-11-07 DIAGNOSIS — K635 Polyp of colon: Secondary | ICD-10-CM

## 2017-11-07 DIAGNOSIS — K921 Melena: Secondary | ICD-10-CM | POA: Diagnosis not present

## 2017-11-07 HISTORY — PX: COLONOSCOPY: SHX174

## 2017-11-07 MED ORDER — SODIUM CHLORIDE 0.9 % IV SOLN: 500.0000 mL | Freq: Once | INTRAVENOUS | Status: DC

## 2017-11-07 NOTE — Progress Notes (Signed)
Report given to PACU, vss 

## 2017-11-07 NOTE — Progress Notes (Signed)
Called to room to assist during endoscopic procedure.  Patient ID and intended procedure confirmed with present staff. Received instructions for my participation in the procedure from the performing physician.  

## 2017-11-07 NOTE — Progress Notes (Signed)
While getting vs in admitting, pt reported he ate breakfast, lunch and dinner yesterday.  He also ate breakfast this am 1 fried egg, 1 cookie and I small cupcake.  Pt reported he di drink all of the Suprep - 1 bottle last night and I bottle today at 10:30.  Pt is reporting clear liquid results from last BM today.  Dr. Havery Moros was made aware and he said we will proceed with colonoscopy today since pt reported clear liquid. MAW/RN

## 2017-11-07 NOTE — Patient Instructions (Signed)
*  Handouts given on polyps **   YOU HAD AN ENDOSCOPIC PROCEDURE TODAY AT THE [facility]:   Refer to the procedure report that was given to you for any specific questions about what was found during the examination.  If the procedure report does not answer your questions, please call your gastroenterologist to clarify.  If you requested that your care partner not be given the details of your procedure findings, then the procedure report has been included in a sealed envelope for you to review at your convenience later.  YOU SHOULD EXPECT: Some feelings of bloating in the abdomen. Passage of more gas than usual.  Walking can help get rid of the air that was put into your GI tract during the procedure and reduce the bloating. If you had a lower endoscopy (such as a colonoscopy or flexible sigmoidoscopy) you may notice spotting of blood in your stool or on the toilet paper. If you underwent a bowel prep for your procedure, you may not have a normal bowel movement for a few days.  Please Note:  You might notice some irritation and congestion in your nose or some drainage.  This is from the oxygen used during your procedure.  There is no need for concern and it should clear up in a day or so.  SYMPTOMS TO REPORT IMMEDIATELY:   Following lower endoscopy (colonoscopy or flexible sigmoidoscopy):  Excessive amounts of blood in the stool  Significant tenderness or worsening of abdominal pains  Swelling of the abdomen that is new, acute  Fever of 100F or higher  For urgent or emergent issues, a gastroenterologist can be reached at any hour by calling [phone].   DIET:  We do recommend a small meal at first, but then you may proceed to your regular diet.  Drink plenty of fluids but you should avoid alcoholic beverages for 24 hours.  ACTIVITY:  You should plan to take it easy for the rest of today and you should NOT DRIVE or use heavy machinery until tomorrow (because of the sedation  medicines used during the test).    FOLLOW UP: Our staff will call the number listed on your records the next business day following your procedure to check on you and address any questions or concerns that you may have regarding the information given to you following your procedure. If we do not reach you, we will leave a message.  However, if you are feeling well and you are not experiencing any problems, there is no need to return our call.  We will assume that you have returned to your regular daily activities without incident.  If any biopsies were taken you will be contacted by phone or by letter within the next 1-3 weeks.  Please call us at [phone] if you have not heard about the biopsies in 3 weeks.    SIGNATURES/CONFIDENTIALITY: You and/or your care partner have signed paperwork which will be entered into your electronic medical record.  These signatures attest to the fact that that the information above on your After Visit Summary has been reviewed and is understood.  Full responsibility of the confidentiality of this discharge information lies with you and/or your care-partner. 

## 2017-11-07 NOTE — Op Note (Signed)
[place] Patient Name: Carl Harris Procedure Date: 11/07/2017 3:37 PM MRN: [id number] Endoscopist: Remo Lipps P. Delores Edelstein MD, MD Age: 67 Referring MD:  Date of Birth: January 23, 1951 Gender: Male Account #: [id number] Procedure:                Colonoscopy Indications:              Hematochezia Medicines:                Monitored Anesthesia Care Procedure:                Pre-Anesthesia Assessment:                           - Prior to the procedure, a History and Physical                            was performed, and patient medications and                            allergies were reviewed. The patient's tolerance of                            previous anesthesia was also reviewed. The risks                            and benefits of the procedure and the sedation                            options and risks were discussed with the patient.                            All questions were answered, and informed consent                            was obtained. Prior Anticoagulants: The patient has                            taken no previous anticoagulant or antiplatelet                            agents. ASA Grade Assessment: II - A patient with                            mild systemic disease. After reviewing the risks                            and benefits, the patient was deemed in                            satisfactory condition to undergo the procedure.                           After obtaining informed consent, the colonoscope  was passed under direct vision. Throughout the                            procedure, the patient's blood pressure, pulse, and                            oxygen saturations were monitored continuously. The                            Colonoscope was introduced through the anus and                            advanced to the the cecum, identified by                            appendiceal orifice and ileocecal valve. The                  colonoscopy was performed without difficulty. The                            patient tolerated the procedure well. The quality                            of the bowel preparation was adequate. The                            ileocecal valve, appendiceal orifice, and rectum                            were photographed. Scope In: 3:45:09 PM Scope Out: 4:05:03 PM Scope Withdrawal Time: 0 hours 15 minutes 53 seconds  Total Procedure Duration: 0 hours 19 minutes 54 seconds  Findings:                 The perianal and digital rectal examinations were                            normal.                           Two sessile polyps were found in the cecum. The                            polyps were 3 mm in size. These polyps were removed                            with a cold snare. Resection and retrieval were                            complete.                           A 3 mm polyp was found in the ascending colon. The  polyp was sessile. The polyp was removed with a                            cold snare. Resection and retrieval were complete.                           A 5 mm polyp was found in the transverse colon. The                            polyp was sessile. The polyp was removed with a                            cold snare. Resection and retrieval were complete.                           A 5 mm polyp was found in the splenic flexure. The                            polyp was sessile. The polyp was removed with a                            cold snare. Resection and retrieval were complete.                           A 6 mm polyp was found in the descending colon. The                            polyp was sessile. The polyp was removed with a                            cold snare. Resection and retrieval were complete.                           Internal hemorrhoids were found during                            retroflexion. The hemorrhoids were moderate  in size                            and inflamed.                           The exam was otherwise without abnormality. Complications:            No immediate complications. Estimated blood loss:                            Minimal. Estimated Blood Loss:     Estimated blood loss was minimal. Impression:               - Two 3 mm polyps in the cecum, removed with a cold  snare. Resected and retrieved.                           - One 3 mm polyp in the ascending colon, removed                            with a cold snare. Resected and retrieved.                           - One 5 mm polyp in the transverse colon, removed                            with a cold snare. Resected and retrieved.                           - One 5 mm polyp at the splenic flexure, removed                            with a cold snare. Resected and retrieved.                           - One 6 mm polyp in the descending colon, removed                            with a cold snare. Resected and retrieved.                           - Inflamed Internal hemorrhoids which are the                            likely cause of bleeding symptoms.                           - The examination was otherwise normal. Recommendation:           - Patient has a contact number available for                            emergencies. The signs and symptoms of potential                            delayed complications were discussed with the                            patient. Return to normal activities tomorrow.                            Written discharge instructions were provided to the                            patient.                           - Resume previous diet.                           -  Continue present medications.                           - Await pathology results.                           - Repeat colonoscopy date to be determined after                            pending pathology results are reviewed  for                            surveillance.                           - Consideration for hemorrhoid banding for                            treatment of hemorrhoids Tylan Briguglio P. Sydney Azure MD, MD 11/07/2017 4:10:05 PM This report has been signed electronically.

## 2017-11-07 NOTE — Progress Notes (Signed)
Patient states he can understand and speak [place], and an interpreter was offered, but patient refused.

## 2017-11-08 ENCOUNTER — Telehealth: Payer: Self-pay | Admitting: *Deleted

## 2017-11-08 NOTE — Telephone Encounter (Signed)
  Follow up Call-  Call back number 11/07/2017  Post procedure Call Back phone  # [phone]  Permission to leave phone message Yes  Some recent data might be hidden     Patient questions:  Do you have a fever, pain , or abdominal swelling? No. Pain Score  0 *  Have you tolerated food without any problems? Yes.    Have you been able to return to your normal activities? Yes.    Do you have any questions about your discharge instructions: Diet   No. Medications  No. Follow up visit  No.  Do you have questions or concerns about your Care? No.  Actions: * If pain score is 4 or above: No action needed, pain <4.

## 2017-11-14 ENCOUNTER — Encounter: Payer: Self-pay | Admitting: Gastroenterology

## 2018-01-11 ENCOUNTER — Encounter: Payer: Self-pay | Admitting: Gastroenterology

## 2018-01-11 ENCOUNTER — Ambulatory Visit (INDEPENDENT_AMBULATORY_CARE_PROVIDER_SITE_OTHER): Payer: Medicare Other | Admitting: Gastroenterology

## 2018-01-11 ENCOUNTER — Encounter (INDEPENDENT_AMBULATORY_CARE_PROVIDER_SITE_OTHER): Payer: Self-pay

## 2018-01-11 VITALS — BP 140/72 | HR 78 | Ht 63.0 in | Wt 117.0 lb

## 2018-01-11 DIAGNOSIS — K648 Other hemorrhoids: Secondary | ICD-10-CM

## 2018-01-11 DIAGNOSIS — K640 First degree hemorrhoids: Secondary | ICD-10-CM | POA: Diagnosis not present

## 2018-01-11 NOTE — Progress Notes (Signed)
67 y/o male with a history of rectal bleeding here for management of hemorrhoids. Colonoscopy 11/07/2017 showing inflamed internal hemorrhoids and multiple small adenomas. We discussed whether or not he wanted further management of hemorrhoids. He is using fiber supplement daily, having intermittent mild bleeding symptoms. He has had intermittent bleeding from his hemorrhoids for a long time. He wanted to proceed with banding today after a discussion of options (banding, surgery, medical therapy)   PROCEDURE NOTE: The patient presents with symptomatic grade I  hemorrhoids, requesting rubber band ligation of his/her hemorrhoidal disease.  All risks, benefits and alternative forms of therapy were described and informed consent was obtained.  In the Left Lateral Decubitus position anoscopic examination revealed grade I hemorrhoids in all positions, worse in LL / RA.  The anorectum was pre-medicated with 0.1325% nitroglycerin The decision was made to band the LL internal hemorrhoid, and the [place] was used to perform band ligation without complication.  Digital anorectal examination was then performed to assure proper positioning of the band in the LL to RA area, and to adjust the banded tissue as required.  The patient was discharged home without pain or other issues.  Dietary and behavioral recommendations were given and along with follow-up instructions.     The following adjunctive treatments were recommended: Daily fiber supplement  The patient will return in 2-4 weeks for  follow-up and possible additional banding as required. No complications were encountered and the patient tolerated the procedure well.  Bostic Cellar, MD [facility]

## 2018-01-11 NOTE — Patient Instructions (Signed)
If you are age 66 or older, your body mass index should be between 23-30. Your Body mass index is 20.73 kg/m. If this is out of the aforementioned range listed, please consider follow up with your Primary Care Provider.  If you are age 31 or younger, your body mass index should be between 19-25. Your Body mass index is 20.73 kg/m. If this is out of the aformentioned range listed, please consider follow up with your Primary Care Provider.   HEMORRHOID BANDING PROCEDURE    FOLLOW-UP CARE   1. The procedure you have had should have been relatively painless since the banding of the area involved does not have nerve endings and there is no pain sensation.  The rubber band cuts off the blood supply to the hemorrhoid and the band may fall off as soon as 48 hours after the banding (the band may occasionally be seen in the toilet bowl following a bowel movement). You may notice a temporary feeling of fullness in the rectum which should respond adequately to plain Tylenol or Motrin.  2. Following the banding, avoid strenuous exercise that evening and resume full activity the next day.  A sitz bath (soaking in a warm tub) or bidet is soothing, and can be useful for cleansing the area after bowel movements.     3. To avoid constipation, take two tablespoons of natural wheat bran, natural oat bran, flax, Benefiber or any over the counter fiber supplement and increase your water intake to 7-8 glasses daily.    4. Unless you have been prescribed anorectal medication, do not put anything inside your rectum for two weeks: No suppositories, enemas, fingers, etc.  5. Occasionally, you may have more bleeding than usual after the banding procedure.  This is often from the untreated hemorrhoids rather than the treated one.  Don't be concerned if there is a tablespoon or so of blood.  If there is more blood than this, lie flat with your bottom higher than your head and apply an ice pack to the area. If the bleeding  does not stop within a half an hour or if you feel faint, call our office at [phone] or go to the emergency room.  6. Problems are not common; however, if there is a substantial amount of bleeding, severe pain, chills, fever or difficulty passing urine (very rare) or other problems, you should call us at [phone] or report to the nearest emergency room.  7. Do not stay seated continuously for more than 2-3 hours for a day or two after the procedure.  Tighten your buttock muscles 10-15 times every two hours and take 10-15 deep breaths every 1-2 hours.  Do not spend more than a few minutes on the toilet if you cannot empty your bowel; instead re-visit the toilet at a later time.    Your next appointment with Dr. Havery Moros is on Wednesday, July 24th at 11:15am.  Please call and let us know if you need to reschedule this appointment.  Thank you for entrusting me with your care and for choosing [facility], Dr. Jerome Cellar

## 2018-02-06 ENCOUNTER — Encounter: Payer: Medicare Other | Admitting: Gastroenterology

## 2018-03-15 ENCOUNTER — Encounter: Payer: Medicare Other | Admitting: Gastroenterology

## 2018-04-19 ENCOUNTER — Ambulatory Visit (INDEPENDENT_AMBULATORY_CARE_PROVIDER_SITE_OTHER): Payer: Medicare Other | Admitting: Family Medicine

## 2018-04-19 ENCOUNTER — Encounter: Payer: Self-pay | Admitting: Family Medicine

## 2018-04-19 VITALS — BP 110/70 | HR 92 | Temp 98.7°F | Ht 62.0 in | Wt 121.0 lb

## 2018-04-19 DIAGNOSIS — R053 Chronic cough: Secondary | ICD-10-CM | POA: Insufficient documentation

## 2018-04-19 DIAGNOSIS — R05 Cough: Secondary | ICD-10-CM | POA: Diagnosis not present

## 2018-04-19 NOTE — Progress Notes (Signed)
   Subjective:    Patient ID: Carl Harris, male    DOB: [birth year], 67 y.o.   MRN: [id number]  Cough  This is a new problem. The current episode started more than 1 year ago (has had similar issues off and on  when he was young, no issues in last 5-7 year until last year). The problem has been waxing and waning. The problem occurs every few minutes. The cough is non-productive. Associated symptoms include postnasal drip and shortness of breath. Pertinent negatives include no ear congestion, ear pain, fever, myalgias, nasal congestion, sore throat or wheezing. Associated symptoms comments:  Triggers of cough and SOB: strong smell, talking a lot, excited.  no issue with SOB with exercise.. The symptoms are aggravated by fumes, stress and dust. Treatments tried:  HAs tried chloroseptic spray to treat., throat lozanges. There is no history of asthma, COPD or environmental allergies.   No clear seasonal symptoms.   Has had similar issues off and on in past.    No new exposures Nonsmoker,  Father with secondhand smoke  Review of Systems  Constitutional: Negative for fever.  HENT: Positive for postnasal drip. Negative for ear pain and sore throat.   Respiratory: Positive for cough and shortness of breath. Negative for wheezing.   Musculoskeletal: Negative for myalgias.  Allergic/Immunologic: Negative for environmental allergies.       Objective:   Physical Exam  Constitutional: Vital signs are normal. He appears well-developed and well-nourished.  Non-toxic appearance. He does not appear ill. No distress.  HENT:  Head: Normocephalic and atraumatic.  Right Ear: Hearing, tympanic membrane, external ear and ear canal normal. No tenderness. No foreign bodies. Tympanic membrane is not retracted and not bulging.  Left Ear: Hearing, tympanic membrane, external ear and ear canal normal. No tenderness. No foreign bodies. Tympanic membrane is not retracted and not bulging.  Nose: Nose normal. No  mucosal edema or rhinorrhea. Right sinus exhibits no maxillary sinus tenderness and no frontal sinus tenderness. Left sinus exhibits no maxillary sinus tenderness and no frontal sinus tenderness.  Mouth/Throat: Uvula is midline, oropharynx is clear and moist and mucous membranes are normal. Normal dentition. No dental caries. No oropharyngeal exudate or tonsillar abscesses.  Eyes: Pupils are equal, round, and reactive to light. Conjunctivae, EOM and lids are normal. Lids are everted and swept, no foreign bodies found.  Neck: Trachea normal, normal range of motion and phonation normal. Neck supple. Carotid bruit is not present. No thyroid mass and no thyromegaly present.  Cardiovascular: Normal rate, regular rhythm, S1 normal, S2 normal, normal heart sounds, intact distal pulses and normal pulses. Exam reveals no gallop.  No murmur heard. Pulmonary/Chest: Effort normal and breath sounds normal. No respiratory distress. He has no wheezes. He has no rhonchi. He has no rales.  Abdominal: Soft. Normal appearance and bowel sounds are normal. There is no hepatosplenomegaly. There is no tenderness. There is no rebound, no guarding and no CVA tenderness. No hernia.  Neurological: He is alert. He has normal reflexes.  Skin: Skin is warm, dry and intact. No rash noted.  Psychiatric: He has a normal mood and affect. His speech is normal and behavior is normal. Judgment normal.          Assessment & Plan:

## 2018-04-19 NOTE — Assessment & Plan Note (Signed)
Most consistent with allergies.. Treat with zyrtec and flonase. No sign of bacterial infeciton.

## 2018-04-19 NOTE — Patient Instructions (Signed)
Start zyrtec (cetrizine) at bedtime.. If not improving cough symptoms in 1 week.. Start flonase (fluticasone generic)  2 sprays per nostril daily.  If still not better..  Follow up for further evaluation.

## 2018-07-04 ENCOUNTER — Ambulatory Visit (INDEPENDENT_AMBULATORY_CARE_PROVIDER_SITE_OTHER): Payer: Medicare Other

## 2018-07-04 DIAGNOSIS — Z23 Encounter for immunization: Secondary | ICD-10-CM | POA: Diagnosis not present

## 2018-07-31 ENCOUNTER — Ambulatory Visit (INDEPENDENT_AMBULATORY_CARE_PROVIDER_SITE_OTHER)
Admission: RE | Admit: 2018-07-31 | Discharge: 2018-07-31 | Disposition: A | Discharge status: Home / Self Care | Payer: Medicare Other | Source: Ambulatory Visit | Attending: Internal Medicine | Admitting: Internal Medicine

## 2018-07-31 ENCOUNTER — Ambulatory Visit: Payer: Medicare Other | Admitting: Internal Medicine

## 2018-07-31 ENCOUNTER — Encounter: Payer: Self-pay | Admitting: Internal Medicine

## 2018-07-31 VITALS — BP 134/80 | HR 82 | Temp 98.5°F | Ht 62.0 in | Wt 119.0 lb

## 2018-07-31 DIAGNOSIS — R05 Cough: Secondary | ICD-10-CM

## 2018-07-31 DIAGNOSIS — L03211 Cellulitis of face: Secondary | ICD-10-CM | POA: Diagnosis not present

## 2018-07-31 DIAGNOSIS — R053 Chronic cough: Secondary | ICD-10-CM

## 2018-07-31 MED ORDER — AMOXICILLIN-POT CLAVULANATE 875-125 MG PO TABS: 1.0000 | ORAL_TABLET | Freq: Two times a day (BID) | ORAL | 0 refills | Status: DC

## 2018-07-31 NOTE — Assessment & Plan Note (Signed)
Will treat with augmentin Warm compresses prn tylenol

## 2018-07-31 NOTE — Assessment & Plan Note (Signed)
He notes it in the winter only No history of asthma Will just check CXR --just in case
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
## 2018-09-11 ENCOUNTER — Ambulatory Visit: Payer: Medicare Other

## 2018-09-17 ENCOUNTER — Ambulatory Visit: Payer: Medicare Other | Admitting: Family Medicine

## 2018-10-31 ENCOUNTER — Ambulatory Visit: Payer: Medicare Other

## 2018-11-08 ENCOUNTER — Encounter: Payer: Medicare Other | Admitting: Family Medicine

## 2019-05-22 ENCOUNTER — Telehealth: Payer: Self-pay | Admitting: Family Medicine

## 2019-05-22 DIAGNOSIS — E7800 Pure hypercholesterolemia, unspecified: Secondary | ICD-10-CM

## 2019-05-22 DIAGNOSIS — R972 Elevated prostate specific antigen [PSA]: Secondary | ICD-10-CM

## 2019-05-22 NOTE — Telephone Encounter (Signed)
-----   Message from [name] sent at 05/12/2019  2:44 PM EDT ----- Regarding: Lab orders for Friday, 11.6.20 Patient is scheduled for CPX labs, please order future labs, Thanks , Karna Christmas

## 2019-05-23 ENCOUNTER — Ambulatory Visit (INDEPENDENT_AMBULATORY_CARE_PROVIDER_SITE_OTHER): Payer: Medicare Other

## 2019-05-23 ENCOUNTER — Other Ambulatory Visit (INDEPENDENT_AMBULATORY_CARE_PROVIDER_SITE_OTHER): Payer: Medicare Other

## 2019-05-23 DIAGNOSIS — R972 Elevated prostate specific antigen [PSA]: Secondary | ICD-10-CM | POA: Diagnosis not present

## 2019-05-23 DIAGNOSIS — Z0000 Encounter for general adult medical examination without abnormal findings: Secondary | ICD-10-CM | POA: Diagnosis not present

## 2019-05-23 DIAGNOSIS — E7800 Pure hypercholesterolemia, unspecified: Secondary | ICD-10-CM

## 2019-05-23 LAB — LIPID PANEL
Cholesterol: 236 mg/dL — ABNORMAL HIGH (ref 0–200)
HDL: 60.4 mg/dL (ref >39.00)
LDL Cholesterol: 158 mg/dL — ABNORMAL HIGH (ref 0–99)
NonHDL: 175.39
Total CHOL/HDL Ratio: 4
Triglycerides: 89 mg/dL (ref 0.0–149.0)
VLDL: 17.8 mg/dL (ref 0.0–40.0)

## 2019-05-23 LAB — COMPREHENSIVE METABOLIC PANEL
ALT: 13 U/L (ref 0–53)
AST: 21 U/L (ref 0–37)
Albumin: 4.2 g/dL (ref 3.5–5.2)
Alkaline Phosphatase: 68 U/L (ref 39–117)
BUN: 22 mg/dL (ref 6–23)
CO2: 26 mEq/L (ref 19–32)
Calcium: 9.1 mg/dL (ref 8.4–10.5)
Chloride: 105 mEq/L (ref 96–112)
Creatinine, Ser: 1.07 mg/dL (ref 0.40–1.50)
GFR: 68.91 mL/min (ref >60.00)
Glucose, Bld: 104 mg/dL — ABNORMAL HIGH (ref 70–99)
Potassium: 4.3 mEq/L (ref 3.5–5.1)
Sodium: 139 mEq/L (ref 135–145)
Total Bilirubin: 0.6 mg/dL (ref 0.2–1.2)
Total Protein: 7 g/dL (ref 6.0–8.3)

## 2019-05-23 LAB — PSA, MEDICARE: PSA: 8.53 ng/ml — ABNORMAL HIGH (ref 0.10–4.00)

## 2019-05-23 NOTE — Progress Notes (Signed)
PCP notes:  Health Maintenance: Needs flu and pneumovax 23 vaccines Declined Shingrix  Abnormal Screenings: none   Patient concerns: Patient has had a cough for over 1 year now. He is concerned about this and wants to have testing to find out what is going on.    Nurse concerns: none   Next PCP appt.: 05/29/2019 @ 2:20 pm
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
## 2019-05-23 NOTE — Patient Instructions (Signed)
Carl Harris , Thank you for taking time to come for your Medicare Wellness Visit. I appreciate your ongoing commitment to your health goals. Please review the following plan we discussed and let me know if I can assist you in the future.   Screening recommendations/referrals: Colonoscopy: up to date, completed 11/07/2017 Recommended yearly ophthalmology/optometry visit for glaucoma screening and checkup Recommended yearly dental visit for hygiene and checkup  Vaccinations: Influenza vaccine: will get at physical Pneumococcal vaccine: will get at physical Tdap vaccine: up to date, completed 08/30/2010 Shingles vaccine: declined    Advanced directives: Please bring a copy of your POA (Power of [place]) and/or Living Will to your next appointment.   Conditions/risks identified: hypercholesterolemia  Next appointment: 05/29/2019 @ 2:20 pm   Preventive Care 65 Years and Older, Male Preventive care refers to lifestyle choices and visits with your health care provider that can promote health and wellness. What does preventive care include?  A yearly physical exam. This is also called an annual well check.  Dental exams once or twice a year.  Routine eye exams. Ask your health care provider how often you should have your eyes checked.  Personal lifestyle choices, including:  Daily care of your teeth and gums.  Regular physical activity.  Eating a healthy diet.  Avoiding tobacco and drug use.  Limiting alcohol use.  Practicing safe sex.  Taking low doses of aspirin every day.  Taking vitamin and mineral supplements as recommended by your health care provider. What happens during an annual well check? The services and screenings done by your health care provider during your annual well check will depend on your age, overall health, lifestyle risk factors, and family history of disease. Counseling  Your health care provider may ask you questions about your:  Alcohol use.   Tobacco use.  Drug use.  Emotional well-being.  Home and relationship well-being.  Sexual activity.  Eating habits.  History of falls.  Memory and ability to understand (cognition).  Work and work Statistician. Screening  You may have the following tests or measurements:  Height, weight, and BMI.  Blood pressure.  Lipid and cholesterol levels. These may be checked every 5 years, or more frequently if you are over 27 years old.  Skin check.  Lung cancer screening. You may have this screening every year starting at age 46 if you have a 30-pack-year history of smoking and currently smoke or have quit within the past 15 years.  Fecal occult blood test (FOBT) of the stool. You may have this test every year starting at age 34.  Flexible sigmoidoscopy or colonoscopy. You may have a sigmoidoscopy every 5 years or a colonoscopy every 10 years starting at age 13.  Prostate cancer screening. Recommendations will vary depending on your family history and other risks.  Hepatitis C blood test.  Hepatitis B blood test.  Sexually transmitted disease (STD) testing.  Diabetes screening. This is done by checking your blood sugar (glucose) after you have not eaten for a while (fasting). You may have this done every 1-3 years.  Abdominal aortic aneurysm (AAA) screening. You may need this if you are a current or former smoker.  Osteoporosis. You may be screened starting at age 35 if you are at high risk. Talk with your health care provider about your test results, treatment options, and if necessary, the need for more tests. Vaccines  Your health care provider may recommend certain vaccines, such as:  Influenza vaccine. This is recommended every year.  Tetanus, diphtheria, and acellular pertussis (Tdap, Td) vaccine. You may need a Td booster every 10 years.  Zoster vaccine. You may need this after age 82.  Pneumococcal 13-valent conjugate (PCV13) vaccine. One dose is recommended  after age 74.  Pneumococcal polysaccharide (PPSV23) vaccine. One dose is recommended after age 59. Talk to your health care provider about which screenings and vaccines you need and how often you need them. This information is not intended to replace advice given to you by your health care provider. Make sure you discuss any questions you have with your health care provider. Document Released: 07/30/2015 Document Revised: 03/22/2016 Document Reviewed: 05/04/2015 Elsevier Interactive Patient Education  2017 [place] Prevention in the Home Falls can cause injuries. They can happen to people of all ages. There are many things you can do to make your home safe and to help prevent falls. What can I do on the outside of my home?  Regularly fix the edges of walkways and driveways and fix any cracks.  Remove anything that might make you trip as you walk through a door, such as a raised step or threshold.  Trim any bushes or trees on the path to your home.  Use bright outdoor lighting.  Clear any walking paths of anything that might make someone trip, such as rocks or tools.  Regularly check to see if handrails are loose or broken. Make sure that both sides of any steps have handrails.  Any raised decks and porches should have guardrails on the edges.  Have any leaves, snow, or ice cleared regularly.  Use sand or salt on walking paths during winter.  Clean up any spills in your garage right away. This includes oil or grease spills. What can I do in the bathroom?  Use night lights.  Install grab bars by the toilet and in the tub and shower. Do not use towel bars as grab bars.  Use non-skid mats or decals in the tub or shower.  If you need to sit down in the shower, use a plastic, non-slip stool.  Keep the floor dry. Clean up any water that spills on the floor as soon as it happens.  Remove soap buildup in the tub or shower regularly.  Attach bath mats securely with  double-sided non-slip rug tape.  Do not have throw rugs and other things on the floor that can make you trip. What can I do in the bedroom?  Use night lights.  Make sure that you have a light by your bed that is easy to reach.  Do not use any sheets or blankets that are too big for your bed. They should not hang down onto the floor.  Have a firm chair that has side arms. You can use this for support while you get dressed.  Do not have throw rugs and other things on the floor that can make you trip. What can I do in the kitchen?  Clean up any spills right away.  Avoid walking on wet floors.  Keep items that you use a lot in easy-to-reach places.  If you need to reach something above you, use a strong step stool that has a grab bar.  Keep electrical cords out of the way.  Do not use floor polish or wax that makes floors slippery. If you must use wax, use non-skid floor wax.  Do not have throw rugs and other things on the floor that can make you trip. What can I do  with my stairs?  Do not leave any items on the stairs.  Make sure that there are handrails on both sides of the stairs and use them. Fix handrails that are broken or loose. Make sure that handrails are as long as the stairways.  Check any carpeting to make sure that it is firmly attached to the stairs. Fix any carpet that is loose or worn.  Avoid having throw rugs at the top or bottom of the stairs. If you do have throw rugs, attach them to the floor with carpet tape.  Make sure that you have a light switch at the top of the stairs and the bottom of the stairs. If you do not have them, ask someone to add them for you. What else can I do to help prevent falls?  Wear shoes that:  Do not have high heels.  Have rubber bottoms.  Are comfortable and fit you well.  Are closed at the toe. Do not wear sandals.  If you use a stepladder:  Make sure that it is fully opened. Do not climb a closed stepladder.  Make  sure that both sides of the stepladder are locked into place.  Ask someone to hold it for you, if possible.  Clearly mark and make sure that you can see:  Any grab bars or handrails.  First and last steps.  Where the edge of each step is.  Use tools that help you move around (mobility aids) if they are needed. These include:  Canes.  Walkers.  Scooters.  Crutches.  Turn on the lights when you go into a dark area. Replace any light bulbs as soon as they burn out.  Set up your furniture so you have a clear path. Avoid moving your furniture around.  If any of your floors are uneven, fix them.  If there are any pets around you, be aware of where they are.  Review your medicines with your doctor. Some medicines can make you feel dizzy. This can increase your chance of falling. Ask your doctor what other things that you can do to help prevent falls. This information is not intended to replace advice given to you by your health care provider. Make sure you discuss any questions you have with your health care provider. Document Released: 04/29/2009 Document Revised: 12/09/2015 Document Reviewed: 08/07/2014 Elsevier Interactive Patient Education  2017 [employer].

## 2019-05-23 NOTE — Progress Notes (Signed)
No critical labs need to be addressed urgently. We will discuss labs in detail at upcoming office visit.   

## 2019-05-29 ENCOUNTER — Encounter: Payer: Medicare Other | Admitting: Family Medicine

## 2019-05-30 ENCOUNTER — Ambulatory Visit (INDEPENDENT_AMBULATORY_CARE_PROVIDER_SITE_OTHER): Payer: Medicare Other | Admitting: Family Medicine

## 2019-05-30 ENCOUNTER — Other Ambulatory Visit: Payer: Self-pay

## 2019-05-30 ENCOUNTER — Encounter: Payer: Self-pay | Admitting: Family Medicine

## 2019-05-30 VITALS — BP 126/82 | HR 81 | Temp 97.3°F | Ht 61.5 in | Wt 113.0 lb

## 2019-05-30 DIAGNOSIS — R972 Elevated prostate specific antigen [PSA]: Secondary | ICD-10-CM | POA: Diagnosis not present

## 2019-05-30 DIAGNOSIS — Z1211 Encounter for screening for malignant neoplasm of colon: Secondary | ICD-10-CM

## 2019-05-30 DIAGNOSIS — E7800 Pure hypercholesterolemia, unspecified: Secondary | ICD-10-CM

## 2019-05-30 DIAGNOSIS — Z23 Encounter for immunization: Secondary | ICD-10-CM | POA: Diagnosis not present

## 2019-05-30 DIAGNOSIS — R053 Chronic cough: Secondary | ICD-10-CM

## 2019-05-30 DIAGNOSIS — R05 Cough: Secondary | ICD-10-CM

## 2019-05-30 DIAGNOSIS — R350 Frequency of micturition: Secondary | ICD-10-CM

## 2019-05-30 DIAGNOSIS — Z0000 Encounter for general adult medical examination without abnormal findings: Secondary | ICD-10-CM | POA: Diagnosis not present

## 2019-05-30 DIAGNOSIS — N401 Enlarged prostate with lower urinary tract symptoms: Secondary | ICD-10-CM

## 2019-05-30 MED ORDER — ATORVASTATIN CALCIUM 40 MG PO TABS: 40.0000 mg | ORAL_TABLET | Freq: Every day | ORAL | 3 refills | Status: DC

## 2019-05-30 NOTE — Assessment & Plan Note (Signed)
Followed by [facility]. Stable  PSA.
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
## 2019-05-30 NOTE — Assessment & Plan Note (Signed)
OSme language barrier but sound most liek allergies/Ashtma.   Send to allergist for spirometry ( cannot do in our office given COVID) and possible allergy testing.  neg CXR.  NO clear GERD or infeciton. Pt vaccinated for TB as child.

## 2019-05-30 NOTE — Addendum Note (Signed)
Addended by: Lurlean Nanny on: 05/30/2019 02:13 PM   Modules accepted: Orders

## 2019-05-30 NOTE — Assessment & Plan Note (Signed)
Poor control.. reviewed importance of cholesterol med and encouraged pt to restart.

## 2019-05-30 NOTE — Patient Instructions (Addendum)
We will call with referral... to allergist for possible breathing test or allergy testing.  Restart atorvastatin 40 mg daily.. return for cholesterol check in 3 months  Pick up stool test when able. Have wife call about thyroid test needed.

## 2019-05-30 NOTE — Assessment & Plan Note (Signed)
Stable non-bothersome symptoms at this point.

## 2019-06-06 ENCOUNTER — Encounter: Payer: Self-pay | Admitting: *Deleted

## 2019-06-06 ENCOUNTER — Other Ambulatory Visit (INDEPENDENT_AMBULATORY_CARE_PROVIDER_SITE_OTHER): Payer: Medicare Other

## 2019-06-06 DIAGNOSIS — Z1211 Encounter for screening for malignant neoplasm of colon: Secondary | ICD-10-CM | POA: Diagnosis not present

## 2019-06-06 LAB — FECAL OCCULT BLOOD, IMMUNOCHEMICAL: Fecal Occult Bld: NEGATIVE

## 2019-06-20 DIAGNOSIS — J3081 Allergic rhinitis due to animal (cat) (dog) hair and dander: Secondary | ICD-10-CM | POA: Diagnosis not present

## 2019-06-20 DIAGNOSIS — J3089 Other allergic rhinitis: Secondary | ICD-10-CM | POA: Diagnosis not present

## 2019-06-20 DIAGNOSIS — R05 Cough: Secondary | ICD-10-CM | POA: Diagnosis not present

## 2019-06-20 DIAGNOSIS — J301 Allergic rhinitis due to pollen: Secondary | ICD-10-CM | POA: Diagnosis not present

## 2019-09-02 ENCOUNTER — Other Ambulatory Visit (INDEPENDENT_AMBULATORY_CARE_PROVIDER_SITE_OTHER): Payer: Medicare Other

## 2019-09-02 ENCOUNTER — Telehealth: Payer: Self-pay | Admitting: Family Medicine

## 2019-09-02 ENCOUNTER — Other Ambulatory Visit: Payer: Self-pay

## 2019-09-02 DIAGNOSIS — E7800 Pure hypercholesterolemia, unspecified: Secondary | ICD-10-CM

## 2019-09-02 DIAGNOSIS — R972 Elevated prostate specific antigen [PSA]: Secondary | ICD-10-CM | POA: Diagnosis not present

## 2019-09-02 LAB — COMPREHENSIVE METABOLIC PANEL
ALT: 19 U/L (ref 0–53)
AST: 22 U/L (ref 0–37)
Albumin: 4.3 g/dL (ref 3.5–5.2)
Alkaline Phosphatase: 80 U/L (ref 39–117)
BUN: 26 mg/dL — ABNORMAL HIGH (ref 6–23)
CO2: 28 mEq/L (ref 19–32)
Calcium: 9.5 mg/dL (ref 8.4–10.5)
Chloride: 104 mEq/L (ref 96–112)
Creatinine, Ser: 1.11 mg/dL (ref 0.40–1.50)
GFR: 66 mL/min (ref >60.00)
Glucose, Bld: 99 mg/dL (ref 70–99)
Potassium: 4.8 mEq/L (ref 3.5–5.1)
Sodium: 139 mEq/L (ref 135–145)
Total Bilirubin: 0.7 mg/dL (ref 0.2–1.2)
Total Protein: 7.4 g/dL (ref 6.0–8.3)

## 2019-09-02 LAB — LIPID PANEL
Cholesterol: 189 mg/dL (ref 0–200)
HDL: 62.9 mg/dL (ref >39.00)
LDL Cholesterol: 104 mg/dL — ABNORMAL HIGH (ref 0–99)
NonHDL: 126.41
Total CHOL/HDL Ratio: 3
Triglycerides: 112 mg/dL (ref 0.0–149.0)
VLDL: 22.4 mg/dL (ref 0.0–40.0)

## 2019-09-02 LAB — PSA, MEDICARE: PSA: 11.31 ng/ml — ABNORMAL HIGH (ref 0.10–4.00)

## 2019-09-02 NOTE — Telephone Encounter (Signed)
-----   Message from Cloyd Stagers, RT sent at 08/21/2019  2:05 PM EST ----- Regarding: Lab Orders for Tuesday 2.16.2021 Please place lab orders for Tuesday 2.16.2021, appt notes state "3 month labs" Thank you, Dyke Maes RT(R)

## 2019-09-03 ENCOUNTER — Encounter: Payer: Self-pay | Admitting: *Deleted

## 2020-01-06 ENCOUNTER — Telehealth: Payer: Self-pay

## 2020-01-06 NOTE — Telephone Encounter (Signed)
Patients daughter called in to do a TOC for her father. Patient has recently moved closer to [facility] area and would like to see Dr. Anitra Lauth if at all possible.    Please advise

## 2020-01-06 NOTE — Telephone Encounter (Signed)
OK with me.

## 2020-01-07 NOTE — Telephone Encounter (Signed)
Wonderful couple! No concerns here.

## 2020-05-24 ENCOUNTER — Telehealth: Payer: Self-pay | Admitting: Family Medicine

## 2020-05-24 DIAGNOSIS — E7800 Pure hypercholesterolemia, unspecified: Secondary | ICD-10-CM

## 2020-05-24 DIAGNOSIS — R972 Elevated prostate specific antigen [PSA]: Secondary | ICD-10-CM

## 2020-05-24 NOTE — Telephone Encounter (Signed)
-----   Message from Cloyd Stagers, RT sent at 05/11/2020  1:54 PM EDT ----- Regarding: Lab Orders for Tuesday 11.9.2021 Please place lab orders for Tuesday 11.9.2021, office visit for physical on Tuesday 11.16.2021 Thank you, Dyke Maes RT(R)

## 2020-05-25 ENCOUNTER — Other Ambulatory Visit: Payer: Medicare Other

## 2020-06-01 ENCOUNTER — Encounter: Payer: Medicare Other | Admitting: Family Medicine

## 2020-06-02 ENCOUNTER — Encounter: Payer: Self-pay | Admitting: Family Medicine

## 2020-06-02 ENCOUNTER — Other Ambulatory Visit: Payer: Self-pay

## 2020-06-02 ENCOUNTER — Ambulatory Visit (INDEPENDENT_AMBULATORY_CARE_PROVIDER_SITE_OTHER): Payer: Medicare Other | Admitting: Family Medicine

## 2020-06-02 VITALS — BP 162/90 | HR 72 | Temp 97.8°F | Resp 16 | Ht 61.5 in | Wt 118.2 lb

## 2020-06-02 DIAGNOSIS — Z0001 Encounter for general adult medical examination with abnormal findings: Secondary | ICD-10-CM

## 2020-06-02 DIAGNOSIS — R972 Elevated prostate specific antigen [PSA]: Secondary | ICD-10-CM | POA: Diagnosis not present

## 2020-06-02 DIAGNOSIS — I10 Essential (primary) hypertension: Secondary | ICD-10-CM

## 2020-06-02 DIAGNOSIS — E7800 Pure hypercholesterolemia, unspecified: Secondary | ICD-10-CM | POA: Diagnosis not present

## 2020-06-02 DIAGNOSIS — Z23 Encounter for immunization: Secondary | ICD-10-CM

## 2020-06-02 DIAGNOSIS — Z125 Encounter for screening for malignant neoplasm of prostate: Secondary | ICD-10-CM

## 2020-06-02 DIAGNOSIS — Z0000 Encounter for general adult medical examination without abnormal findings: Secondary | ICD-10-CM

## 2020-06-02 LAB — CBC WITH DIFFERENTIAL/PLATELET
Basophils Absolute: 0 10*3/uL (ref 0.0–0.1)
Basophils Relative: 0.7 % (ref 0.0–3.0)
Eosinophils Absolute: 0.1 10*3/uL (ref 0.0–0.7)
Eosinophils Relative: 1.1 % (ref 0.0–5.0)
HCT: 42.4 % (ref 39.0–52.0)
Hemoglobin: 13.7 g/dL (ref 13.0–17.0)
Lymphocytes Relative: 18.4 % (ref 12.0–46.0)
Lymphs Abs: 1 10*3/uL (ref 0.7–4.0)
MCHC: 32.3 g/dL (ref 30.0–36.0)
MCV: 88.8 fl (ref 78.0–100.0)
Monocytes Absolute: 0.4 10*3/uL (ref 0.1–1.0)
Monocytes Relative: 7.8 % (ref 3.0–12.0)
Neutro Abs: 3.8 10*3/uL (ref 1.4–7.7)
Neutrophils Relative %: 72 % (ref 43.0–77.0)
Platelets: 250 10*3/uL (ref 150.0–400.0)
RBC: 4.78 Mil/uL (ref 4.22–5.81)
RDW: 13.5 % (ref 11.5–15.5)
WBC: 5.2 10*3/uL (ref 4.0–10.5)

## 2020-06-02 LAB — LIPID PANEL
Cholesterol: 232 mg/dL — ABNORMAL HIGH (ref 0–200)
HDL: 59 mg/dL (ref >39.00)
LDL Cholesterol: 147 mg/dL — ABNORMAL HIGH (ref 0–99)
NonHDL: 173.4
Total CHOL/HDL Ratio: 4
Triglycerides: 130 mg/dL (ref 0.0–149.0)
VLDL: 26 mg/dL (ref 0.0–40.0)

## 2020-06-02 LAB — COMPREHENSIVE METABOLIC PANEL
ALT: 14 U/L (ref 0–53)
AST: 24 U/L (ref 0–37)
Albumin: 4.2 g/dL (ref 3.5–5.2)
Alkaline Phosphatase: 71 U/L (ref 39–117)
BUN: 20 mg/dL (ref 6–23)
CO2: 25 mEq/L (ref 19–32)
Calcium: 9 mg/dL (ref 8.4–10.5)
Chloride: 105 mEq/L (ref 96–112)
Creatinine, Ser: 1.05 mg/dL (ref 0.40–1.50)
GFR: 73.12 mL/min (ref >60.00)
Glucose, Bld: 90 mg/dL (ref 70–99)
Potassium: 4.4 mEq/L (ref 3.5–5.1)
Sodium: 138 mEq/L (ref 135–145)
Total Bilirubin: 0.6 mg/dL (ref 0.2–1.2)
Total Protein: 7.1 g/dL (ref 6.0–8.3)

## 2020-06-02 LAB — PSA, MEDICARE: PSA: 10 ng/ml — ABNORMAL HIGH (ref 0.10–4.00)

## 2020-06-02 LAB — TSH: TSH: 2.61 u[IU]/mL (ref 0.35–4.50)

## 2020-06-02 MED ORDER — ZOSTER VAC RECOMB ADJUVANTED 50 MCG/0.5ML IM SUSR: 0.5000 mL | Freq: Once | INTRAMUSCULAR | 0 refills | Status: AC
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
## 2020-06-02 NOTE — Patient Instructions (Addendum)
Check your blood pressure and heart rate once a day and write the numbers down so we can go over them together in 1 month.  You can stop taking your aspirin.  Restart your atorvastatin 40mg  once every day.  Contact your urologist->Dr. Lovena Neighbours at [facility] in [place] arrange a follow up appointment for your elevated PSA level (Phone: [phone]).     Health Maintenance, Male Adopting a healthy lifestyle and getting preventive care are important in promoting health and wellness. Ask your health care provider about:  The right schedule for you to have regular tests and exams.  Things you can do on your own to prevent diseases and keep yourself healthy. What should I know about diet, weight, and exercise? Eat a healthy diet   Eat a diet that includes plenty of vegetables, fruits, low-fat dairy products, and lean protein.  Do not eat a lot of foods that are high in solid fats, added sugars, or sodium. Maintain a healthy weight Body mass index (BMI) is a measurement that can be used to identify possible weight problems. It estimates body fat based on height and weight. Your health care provider can help determine your BMI and help you achieve or maintain a healthy weight. Get regular exercise Get regular exercise. This is one of the most important things you can do for your health. Most adults should:  Exercise for at least 150 minutes each week. The exercise should increase your heart rate and make you sweat (moderate-intensity exercise).  Do strengthening exercises at least twice a week. This is in addition to the moderate-intensity exercise.  Spend less time sitting. Even light physical activity can be beneficial. Watch cholesterol and blood lipids Have your blood tested for lipids and cholesterol at 68 years of age, then have this test every 5 years. You may need to have your cholesterol levels checked more often if:  Your lipid or cholesterol levels are  high.  You are older than 68 years of age.  You are at high risk for heart disease. What should I know about cancer screening? Many types of cancers can be detected early and may often be prevented. Depending on your health history and family history, you may need to have cancer screening at various ages. This may include screening for:  Colorectal cancer.  Prostate cancer.  Skin cancer.  Lung cancer. What should I know about heart disease, diabetes, and high blood pressure? Blood pressure and heart disease  High blood pressure causes heart disease and increases the risk of stroke. This is more likely to develop in people who have high blood pressure readings, are of African descent, or are overweight.  Talk with your health care provider about your target blood pressure readings.  Have your blood pressure checked: ? Every 3-5 years if you are 68-35 years of age. ? Every year if you are 68 years old or older.  If you are between the ages of 55 and 4 and are a current or former smoker, ask your health care provider if you should have a one-time screening for abdominal aortic aneurysm (AAA). Diabetes Have regular diabetes screenings. This checks your fasting blood sugar level. Have the screening done:  Once every three years after age 68 if you are at a normal weight and have a low risk for diabetes.  More often and at a younger age if you are overweight or have a high risk for diabetes. What should I know about preventing infection? Hepatitis B If you  have a higher risk for hepatitis B, you should be screened for this virus. Talk with your health care provider to find out if you are at risk for hepatitis B infection. Hepatitis C Blood testing is recommended for:  Everyone born from [birth year] through 1964.  Anyone with known risk factors for hepatitis C. Sexually transmitted infections (STIs)  You should be screened each year for STIs, including gonorrhea and chlamydia,  if: ? You are sexually active and are younger than 68 years of age. ? You are older than 68 years of age and your health care provider tells you that you are at risk for this type of infection. ? Your sexual activity has changed since you were last screened, and you are at increased risk for chlamydia or gonorrhea. Ask your health care provider if you are at risk.  Ask your health care provider about whether you are at high risk for HIV. Your health care provider may recommend a prescription medicine to help prevent HIV infection. If you choose to take medicine to prevent HIV, you should first get tested for HIV. You should then be tested every 3 months for as long as you are taking the medicine. Follow these instructions at home: Lifestyle  Do not use any products that contain nicotine or tobacco, such as cigarettes, e-cigarettes, and chewing tobacco. If you need help quitting, ask your health care provider.  Do not use street drugs.  Do not share needles.  Ask your health care provider for help if you need support or information about quitting drugs. Alcohol use  Do not drink alcohol if your health care provider tells you not to drink.  If you drink alcohol: ? Limit how much you have to 0-2 drinks a day. ? Be aware of how much alcohol is in your drink. In the [place] one drink equals one 12 oz bottle of beer (355 mL), one 5 oz glass of wine (148 mL), or one 1 oz glass of hard liquor (44 mL). General instructions  Schedule regular health, dental, and eye exams.  Stay current with your vaccines.  Tell your health care provider if: ? You often feel depressed. ? You have ever been abused or do not feel safe at home. Summary  Adopting a healthy lifestyle and getting preventive care are important in promoting health and wellness.  Follow your health care provider's instructions about healthy diet, exercising, and getting tested or screened for diseases.  Follow your health care  provider's instructions on monitoring your cholesterol and blood pressure. This information is not intended to replace advice given to you by your health care provider. Make sure you discuss any questions you have with your health care provider. Document Revised: 06/26/2018 Document Reviewed: 06/26/2018 Elsevier Patient Education  2020 [employer].

## 2020-06-07 ENCOUNTER — Telehealth: Payer: Self-pay | Admitting: Family Medicine

## 2020-06-07 MED ORDER — AMLODIPINE BESY-BENAZEPRIL HCL 5-10 MG PO CAPS: 1.0000 | ORAL_CAPSULE | Freq: Every day | ORAL | 1 refills | Status: DC

## 2020-06-07 NOTE — Telephone Encounter (Signed)
OK, bp med eRx'd just now (lotrel 5-10, 1 tab qd).

## 2020-06-07 NOTE — Telephone Encounter (Signed)
Patient was seen 11/17 for establish care visit. Advised to f/u 4 weeks for bp's, check blood pressure and heart rate once daily for review. No medication was sent in at the of appointment. Please see message below and advise.

## 2020-06-07 NOTE — Telephone Encounter (Signed)
Patient came into office, stated at his last appt, Dr. Anitra Lauth said he would prescribe a BP medicine. Patient states he did not receive any BP med and his BP yesterday was 197/96. I do not see a BP med in patient's current med list. Please call patient to advise.

## 2020-06-07 NOTE — Telephone Encounter (Signed)
Patient advised bp med sent in. Keep f/u appt as scheduled for 12/17. Continue to write down blood pressure and heart rate readings for review at appointment.

## 2020-06-17 ENCOUNTER — Telehealth: Payer: Self-pay | Admitting: Family Medicine

## 2020-06-17 NOTE — Telephone Encounter (Signed)
Attempted to schedule AWV. Unable to LVM.  Will try at later time.   Called for patient to schedule Annual Wellness Visit.  Please schedule with Nurse Health Advisor Caroleen Hamman, RN at [facility]

## 2020-06-29 ENCOUNTER — Other Ambulatory Visit: Payer: Self-pay | Admitting: Family Medicine

## 2020-07-02 ENCOUNTER — Other Ambulatory Visit: Payer: Self-pay

## 2020-07-02 ENCOUNTER — Encounter: Payer: Self-pay | Admitting: Family Medicine

## 2020-07-02 ENCOUNTER — Ambulatory Visit (INDEPENDENT_AMBULATORY_CARE_PROVIDER_SITE_OTHER): Payer: Medicare Other | Admitting: Family Medicine

## 2020-07-02 VITALS — BP 125/84 | HR 70 | Temp 97.5°F | Resp 16 | Ht 61.5 in | Wt 118.2 lb

## 2020-07-02 DIAGNOSIS — E7800 Pure hypercholesterolemia, unspecified: Secondary | ICD-10-CM

## 2020-07-02 DIAGNOSIS — R972 Elevated prostate specific antigen [PSA]: Secondary | ICD-10-CM

## 2020-07-02 DIAGNOSIS — N400 Enlarged prostate without lower urinary tract symptoms: Secondary | ICD-10-CM | POA: Diagnosis not present

## 2020-07-02 DIAGNOSIS — I10 Essential (primary) hypertension: Secondary | ICD-10-CM

## 2020-07-02 MED ORDER — ATORVASTATIN CALCIUM 40 MG PO TABS: 40.0000 mg | ORAL_TABLET | Freq: Every day | ORAL | 3 refills | Status: DC
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
## 2020-07-12 ENCOUNTER — Other Ambulatory Visit: Payer: Self-pay | Admitting: Family Medicine

## 2020-07-30 ENCOUNTER — Ambulatory Visit: Payer: Medicare Other | Admitting: Family Medicine

## 2020-08-14 ENCOUNTER — Other Ambulatory Visit: Payer: Self-pay | Admitting: Family Medicine

## 2020-10-27 ENCOUNTER — Telehealth: Payer: Self-pay | Admitting: Family Medicine

## 2020-10-27 NOTE — Telephone Encounter (Signed)
Attempted to schedule AWV. Unable to LVM.  Will try at later time.  

## 2020-11-11 ENCOUNTER — Encounter: Payer: Self-pay | Admitting: Gastroenterology

## 2020-12-09 ENCOUNTER — Other Ambulatory Visit: Payer: Self-pay | Admitting: Family Medicine

## 2020-12-10 ENCOUNTER — Encounter: Payer: Self-pay | Admitting: Gastroenterology

## 2021-01-10 ENCOUNTER — Telehealth: Payer: Self-pay | Admitting: Family Medicine

## 2021-01-10 NOTE — Telephone Encounter (Signed)
Error

## 2021-01-10 NOTE — Telephone Encounter (Signed)
Patient wants to re establish care with you. Patient went to another provider and he was not happy. Would you be willing to accept him back for patient care? Please advise. EM  *Will send mychart message to patient with response. EM

## 2021-01-19 ENCOUNTER — Ambulatory Visit: Payer: Medicare Other | Admitting: Family Medicine

## 2021-01-25 ENCOUNTER — Encounter: Payer: Self-pay | Admitting: Family Medicine

## 2021-01-25 ENCOUNTER — Other Ambulatory Visit: Payer: Self-pay

## 2021-01-25 ENCOUNTER — Ambulatory Visit (INDEPENDENT_AMBULATORY_CARE_PROVIDER_SITE_OTHER): Payer: Medicare Other | Admitting: Family Medicine

## 2021-01-25 VITALS — BP 120/80 | HR 68 | Temp 98.1°F | Ht 61.5 in | Wt 119.5 lb

## 2021-01-25 DIAGNOSIS — N401 Enlarged prostate with lower urinary tract symptoms: Secondary | ICD-10-CM | POA: Diagnosis not present

## 2021-01-25 DIAGNOSIS — I10 Essential (primary) hypertension: Secondary | ICD-10-CM | POA: Diagnosis not present

## 2021-01-25 DIAGNOSIS — E7800 Pure hypercholesterolemia, unspecified: Secondary | ICD-10-CM

## 2021-01-25 DIAGNOSIS — K219 Gastro-esophageal reflux disease without esophagitis: Secondary | ICD-10-CM

## 2021-01-25 DIAGNOSIS — R350 Frequency of micturition: Secondary | ICD-10-CM

## 2021-01-25 NOTE — Patient Instructions (Addendum)
Follow BP at home.. record blood pressure measurements and bring to office or  call with measurements  after 2 weeks.  Remain off  blood pressure medication.  Return for re-evaluation if you are having frequent upset stomach or chest cramping .   Start  omeprazole 20 mg daily for acid reducer to help with chest pain and nausea if it return.  Avoid citrus, caffeine, soda, tomato, alcohol, peppermint, chocloate

## 2021-01-25 NOTE — Progress Notes (Signed)
Patient ID: Carl Harris, male    DOB: Mar 31, 1951, 70 y.o.   MRN: [id number]  This visit was conducted in person.  BP 120/80   Pulse 68   Temp 98.1 F (36.7 C) (Temporal)   Ht 5' 1.5" (1.562 m)   Wt 119 lb 8 oz (54.2 kg)   SpO2 97%   BMI 22.21 kg/m    CC: Chief Complaint  Patient presents with   Re-Establish Care    Subjective:   HPI: Carl Harris is a 70 y.o. male presenting on 01/25/2021 for Re-Establish Care Pt previously seen here.. now re-establishing  Last CPX with Dr. Anitra Lauth reviewed... 07/02/2020.  PSA elevation.. was referred to Urology.  Hypertension:   At goal in office today on NO MEDICATION, used to be on amlodipine /benazapril BP Readings from Last 3 Encounters:  01/25/21 120/80  07/02/20 125/84  06/02/20 (!) 162/90  Using medication without problems or lightheadedness:  none Chest pain with exertion: none occ sharp apin at rest.. thinks it is a muscle. Edema:none Short of breath: Average home BPs: At home 125/85, occ 145/95 Other issues:  Elevated Cholesterol:  Not at goal at last check.. stopped atorvastatin Lab Results  Component Value Date   CHOL 232 (H) 06/02/2020   HDL 59.00 06/02/2020   LDLCALC 147 (H) 06/02/2020   LDLDIRECT 228.5 06/24/2013   TRIG 130.0 06/02/2020   CHOLHDL 4 06/02/2020  Using medications without problems: Muscle aches:  Diet compliance: Exercise: daily 30 min exercise. Other complaints:      Relevant past medical, surgical, family and social history reviewed and updated as indicated. Interim medical history since our last visit reviewed. Allergies and medications reviewed and updated. Outpatient Medications Prior to Visit  Medication Sig Dispense Refill   atorvastatin (LIPITOR) 40 MG tablet Take 1 tablet (40 mg total) by mouth daily. 90 tablet 3   amLODipine-benazepril (LOTREL) 5-10 MG capsule TAKE 1 CAPSULE BY MOUTH EVERY DAY (Patient not taking: Reported on 01/25/2021) 90 capsule 1   No  facility-administered medications prior to visit.     Per HPI unless specifically indicated in ROS section below Review of Systems  Constitutional:  Negative for fatigue and fever.  HENT:  Negative for ear pain.   Eyes:  Negative for pain.  Respiratory:  Negative for cough and shortness of breath.   Cardiovascular:  Negative for chest pain, palpitations and leg swelling.  Gastrointestinal:  Negative for abdominal pain.  Genitourinary:  Negative for dysuria.  Musculoskeletal:  Negative for arthralgias.  Neurological:  Negative for syncope, light-headedness and headaches.  Psychiatric/Behavioral:  Negative for dysphoric mood.   Objective:  BP 120/80   Pulse 68   Temp 98.1 F (36.7 C) (Temporal)   Ht 5' 1.5" (1.562 m)   Wt 119 lb 8 oz (54.2 kg)   SpO2 97%   BMI 22.21 kg/m   Wt Readings from Last 3 Encounters:  01/25/21 119 lb 8 oz (54.2 kg)  07/02/20 118 lb 3.2 oz (53.6 kg)  06/02/20 118 lb 3.2 oz (53.6 kg)      Physical Exam Constitutional:      Appearance: He is well-developed.  HENT:     Head: Normocephalic.     Right Ear: Hearing normal.     Left Ear: Hearing normal.     Nose: Nose normal.  Neck:     Thyroid: No thyroid mass or thyromegaly.     Vascular: No carotid bruit.     Trachea: Trachea normal.  Cardiovascular:     Rate and Rhythm: Normal rate and regular rhythm.     Pulses: Normal pulses.     Heart sounds: Heart sounds not distant. No murmur heard.   No friction rub. No gallop.     Comments: No peripheral edema Pulmonary:     Effort: Pulmonary effort is normal. No respiratory distress.     Breath sounds: Normal breath sounds.  Skin:    General: Skin is warm and dry.     Findings: No rash.  Psychiatric:        Speech: Speech normal.        Behavior: Behavior normal.        Thought Content: Thought content normal.      Results for orders placed or performed in visit on 06/02/20  CBC with Differential/Platelet  Result Value Ref Range   WBC 5.2 4.0  - 10.5 K/uL   RBC 4.78 4.22 - 5.81 Mil/uL   Hemoglobin 13.7 13.0 - 17.0 g/dL   HCT 42.4 39.0 - 52.0 %   MCV 88.8 78.0 - 100.0 fl   MCHC 32.3 30.0 - 36.0 g/dL   RDW 13.5 11.5 - 15.5 %   Platelets 250.0 150.0 - 400.0 K/uL   Neutrophils Relative % 72.0 43.0 - 77.0 %   Lymphocytes Relative 18.4 12.0 - 46.0 %   Monocytes Relative 7.8 3.0 - 12.0 %   Eosinophils Relative 1.1 0.0 - 5.0 %   Basophils Relative 0.7 0.0 - 3.0 %   Neutro Abs 3.8 1.4 - 7.7 K/uL   Lymphs Abs 1.0 0.7 - 4.0 K/uL   Monocytes Absolute 0.4 0.1 - 1.0 K/uL   Eosinophils Absolute 0.1 0.0 - 0.7 K/uL   Basophils Absolute 0.0 0.0 - 0.1 K/uL  TSH  Result Value Ref Range   TSH 2.61 0.35 - 4.50 uIU/mL  Lipid panel  Result Value Ref Range   Cholesterol 232 (H) 0 - 200 mg/dL   Triglycerides 130.0 0.0 - 149.0 mg/dL   HDL 59.00 >39.00 mg/dL   VLDL 26.0 0.0 - 40.0 mg/dL   LDL Cholesterol 147 (H) 0 - 99 mg/dL   Total CHOL/HDL Ratio 4    NonHDL 173.40   Comprehensive metabolic panel  Result Value Ref Range   Sodium 138 135 - 145 mEq/L   Potassium 4.4 3.5 - 5.1 mEq/L   Chloride 105 96 - 112 mEq/L   CO2 25 19 - 32 mEq/L   Glucose, Bld 90 70 - 99 mg/dL   BUN 20 6 - 23 mg/dL   Creatinine, Ser 1.05 0.40 - 1.50 mg/dL   Total Bilirubin 0.6 0.2 - 1.2 mg/dL   Alkaline Phosphatase 71 39 - 117 U/L   AST 24 0 - 37 U/L   ALT 14 0 - 53 U/L   Total Protein 7.1 6.0 - 8.3 g/dL   Albumin 4.2 3.5 - 5.2 g/dL   GFR 73.12 >60.00 mL/min   Calcium 9.0 8.4 - 10.5 mg/dL  PSA, Medicare  Result Value Ref Range   PSA 10.00 (H) 0.10 - 4.00 ng/ml    This visit occurred during the SARS-CoV-2 public health emergency.  Safety protocols were in place, including screening questions prior to the visit, additional usage of staff PPE, and extensive cleaning of exam room while observing appropriate contact time as indicated for disinfecting solutions.   COVID 19 screen:  No recent travel or known exposure to COVID19 The patient denies respiratory  symptoms of COVID 19 at this time. The  importance of social distancing was discussed today.   Assessment and Plan Problem List Items Addressed This Visit     BPH (benign prostatic hyperplasia)    He denies any urinary issues now, no  Nocturia.  Has appt with Urology Dr. Lovena Neighbours  03/01/21  Lab Results  Component Value Date   PSA 10.00 (H) 06/02/2020   PSA 11.31 (H) 09/02/2019   PSA 8.53 (H) 05/23/2019          Gastroesophageal reflux disease    Start  omeprazole 20 mg daily for acid reducer to help with chest pain and nausea if it return.  Avoid citrus, caffeine, soda, tomato, alcohol, peppermint, chocloate      HTN (hypertension)    Chronic, stable   At goal on no medication. Follow BP at home.. record blood pressure measurements and bring to office or  call with measurements  after 2 weeks.  Remain off  blood pressure medication.      HYPERCHOLESTEROLEMIA - Primary    Wants to work on lifestyle changes.. will re-eval at next lab check prior to AMW/CPX in [place].          Eliezer Lofts, MD

## 2021-01-25 NOTE — Assessment & Plan Note (Addendum)
He denies any urinary issues now, no  Nocturia.  Has appt with Urology Dr. Lovena Neighbours  03/01/21  Lab Results  Component Value Date   PSA 10.00 (H) 06/02/2020   PSA 11.31 (H) 09/02/2019   PSA 8.53 (H) 05/23/2019

## 2021-01-25 NOTE — Assessment & Plan Note (Signed)
Wants to work on lifestyle changes.. will re-eval at next lab check prior to AMW/CPX in [place].

## 2021-03-01 ENCOUNTER — Ambulatory Visit (AMBULATORY_SURGERY_CENTER): Payer: Self-pay

## 2021-03-01 ENCOUNTER — Other Ambulatory Visit: Payer: Self-pay

## 2021-03-01 ENCOUNTER — Encounter: Payer: Self-pay | Admitting: Gastroenterology

## 2021-03-01 VITALS — Ht 62.0 in | Wt 122.0 lb

## 2021-03-01 DIAGNOSIS — Z86010 Personal history of colonic polyps: Secondary | ICD-10-CM

## 2021-03-01 MED ORDER — PLENVU 140 G PO SOLR: 1.0000 | ORAL | 0 refills | Status: DC

## 2021-03-01 NOTE — Progress Notes (Signed)
No allergies to soy or egg Pt is not on blood thinners or diet pills Denies issues with sedation/intubation- has had no surgeries but has had colonoscopy before, Denies atrial flutter/fib Denies constipation   Emmi instructions given to pt  Pt is aware of Covid safety and care partner requirements.    Pt is here without an interpreter. But he has a translation app that he used during the PV.  [place] nurse and pt was able to communicate slowly.  Pt encouraged to ask or point out anything he does not understand. Pt was agreeable to this.  (Pt takes a picture of what he is to read and it translated for him so he can read.)  Pt states he understands the information, but will review translations when he gets home and will call for any questions.

## 2021-03-15 ENCOUNTER — Other Ambulatory Visit: Payer: Self-pay

## 2021-03-15 ENCOUNTER — Encounter: Payer: Self-pay | Admitting: Gastroenterology

## 2021-03-15 ENCOUNTER — Ambulatory Visit (AMBULATORY_SURGERY_CENTER): Payer: Medicare Other | Admitting: Gastroenterology

## 2021-03-15 ENCOUNTER — Other Ambulatory Visit: Payer: Self-pay | Admitting: Gastroenterology

## 2021-03-15 VITALS — BP 106/72 | HR 68 | Temp 97.7°F | Resp 19 | Ht 62.0 in | Wt 122.0 lb

## 2021-03-15 DIAGNOSIS — Z86010 Personal history of colonic polyps: Secondary | ICD-10-CM

## 2021-03-15 DIAGNOSIS — D123 Benign neoplasm of transverse colon: Secondary | ICD-10-CM | POA: Diagnosis not present

## 2021-03-15 MED ORDER — SODIUM CHLORIDE 0.9 % IV SOLN: 500.0000 mL | Freq: Once | INTRAVENOUS | Status: DC

## 2021-03-15 NOTE — Progress Notes (Signed)
PT taken to PACU. Monitors in place. VSS. Report given to RN. 

## 2021-03-15 NOTE — Progress Notes (Signed)
Pt's states no medical or surgical changes since previsit or office visit. VS assessed by C.W 
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
## 2021-03-15 NOTE — Progress Notes (Signed)
Called to room to assist during endoscopic procedure.  Patient ID and intended procedure confirmed with present staff. Received instructions for my participation in the procedure from the performing physician.  

## 2021-03-15 NOTE — Op Note (Signed)
[facility] Patient Name: Carl Harris Procedure Date: 03/15/2021 2:20 PM MRN: [id number] Endoscopist: Remo Lipps P. Havery Moros , MD Age: 70 Referring MD:  Date of Birth: [birth year] Gender: Male Account #: [id number] Procedure:                Colonoscopy Indications:              High risk colon cancer surveillance: Personal                            history of colonic polyps (7 adenomas removed in                            2019) Medicines:                Monitored Anesthesia Care Procedure:                Pre-Anesthesia Assessment:                           - Prior to the procedure, a History and Physical                            was performed, and patient medications and                            allergies were reviewed. The patient's tolerance of                            previous anesthesia was also reviewed. The risks                            and benefits of the procedure and the sedation                            options and risks were discussed with the patient.                            All questions were answered, and informed consent                            was obtained. Prior Anticoagulants: The patient has                            taken no previous anticoagulant or antiplatelet                            agents. ASA Grade Assessment: II - A patient with                            mild systemic disease. After reviewing the risks                            and benefits, the patient was deemed in  satisfactory condition to undergo the procedure.                           After obtaining informed consent, the colonoscope                            was passed under direct vision. Throughout the                            procedure, the patient's blood pressure, pulse, and                            oxygen saturations were monitored continuously. The                            Olympus PCF-H190DL [id number]) Colonoscope was                             introduced through the anus and advanced to the the                            cecum, identified by appendiceal orifice and                            ileocecal valve. The colonoscopy was performed                            without difficulty. The patient tolerated the                            procedure well. The quality of the bowel                            preparation was good. The ileocecal valve,                            appendiceal orifice, and rectum were photographed. Scope In: 2:30:47 PM Scope Out: 2:45:29 PM Scope Withdrawal Time: 0 hours 11 minutes 50 seconds  Total Procedure Duration: 0 hours 14 minutes 42 seconds  Findings:                 The perianal and digital rectal examinations were                            normal.                           A 3 mm polyp was found in the transverse colon. The                            polyp was sessile. The polyp was removed with a                            cold snare. Resection and retrieval were complete.  Internal hemorrhoids were found during                            retroflexion. Stigmata of prior banding noted.                           The exam was otherwise without abnormality. Of                            note, I thought a photo of the IC valve was taken                            but it was not. The IC valve was normal. Complications:            No immediate complications. Estimated blood loss:                            Minimal. Estimated Blood Loss:     Estimated blood loss was minimal. Impression:               - One 3 mm polyp in the transverse colon, removed                            with a cold snare. Resected and retrieved.                           - Internal hemorrhoids.                           - The examination was otherwise normal. Recommendation:           - Patient has a contact number available for                            emergencies. The signs and symptoms  of potential                            delayed complications were discussed with the                            patient. Return to normal activities tomorrow.                            Written discharge instructions were provided to the                            patient.                           - Resume previous diet.                           - Continue present medications.                           - Await pathology results. Remo Lipps P. Havery Moros, MD 03/15/2021  2:49:37 PM This report has been signed electronically.

## 2021-03-15 NOTE — Patient Instructions (Signed)
Information on polyps and hemorrhoids given to you today.  Await pathology results.  Resume previous diet and medications.   YOU HAD AN ENDOSCOPIC PROCEDURE TODAY AT [facility]:   Refer to the procedure report that was given to you for any specific questions about what was found during the examination.  If the procedure report does not answer your questions, please call your gastroenterologist to clarify.  If you requested that your care partner not be given the details of your procedure findings, then the procedure report has been included in a sealed envelope for you to review at your convenience later.  YOU SHOULD EXPECT: Some feelings of bloating in the abdomen. Passage of more gas than usual.  Walking can help get rid of the air that was put into your GI tract during the procedure and reduce the bloating. If you had a lower endoscopy (such as a colonoscopy or flexible sigmoidoscopy) you may notice spotting of blood in your stool or on the toilet paper. If you underwent a bowel prep for your procedure, you may not have a normal bowel movement for a few days.  Please Note:  You might notice some irritation and congestion in your nose or some drainage.  This is from the oxygen used during your procedure.  There is no need for concern and it should clear up in a day or so.  SYMPTOMS TO REPORT IMMEDIATELY:  Following lower endoscopy (colonoscopy or flexible sigmoidoscopy):  Excessive amounts of blood in the stool  Significant tenderness or worsening of abdominal pains  Swelling of the abdomen that is new, acute  Fever of 100F or higher   For urgent or emergent issues, a gastroenterologist can be reached at any hour by calling [phone]. Do not use MyChart messaging for urgent concerns.    DIET:  We do recommend a small meal at first, but then you may proceed to your regular diet.  Drink plenty of fluids but you should avoid alcoholic beverages for 24  hours.  ACTIVITY:  You should plan to take it easy for the rest of today and you should NOT DRIVE or use heavy machinery until tomorrow (because of the sedation medicines used during the test).    FOLLOW UP: Our staff will call the number listed on your records 48-72 hours following your procedure to check on you and address any questions or concerns that you may have regarding the information given to you following your procedure. If we do not reach you, we will leave a message.  We will attempt to reach you two times.  During this call, we will ask if you have developed any symptoms of COVID 19. If you develop any symptoms (ie: fever, flu-like symptoms, shortness of breath, cough etc.) before then, please call [phone].  If you test positive for Covid 19 in the 2 weeks post procedure, please call and report this information to [place].    If any biopsies were taken you will be contacted by phone or by letter within the next 1-3 weeks.  Please call us at [phone] if you have not heard about the biopsies in 3 weeks.    SIGNATURES/CONFIDENTIALITY: You and/or your care partner have signed paperwork which will be entered into your electronic medical record.  These signatures attest to the fact that that the information above on your After Visit Summary has been reviewed and is understood.  Full responsibility of the confidentiality of this discharge information lies with you and/or  your care-partner.

## 2021-03-17 ENCOUNTER — Telehealth: Payer: Self-pay | Admitting: *Deleted

## 2021-03-17 NOTE — Telephone Encounter (Signed)
  Follow up Call-  Call back number 03/15/2021  Post procedure Call Back phone  # [phone]  Permission to leave phone message Yes  Some recent data might be hidden     Patient questions:  Do you have a fever, pain , or abdominal swelling? No. Pain Score  0 *  Have you tolerated food without any problems? Yes.    Have you been able to return to your normal activities? Yes.    Do you have any questions about your discharge instructions: Diet   No. Medications  No. Follow up visit  No.  Do you have questions or concerns about your Care? Yes.    Actions: * If pain score is 4 or above: No action needed, pain <4.  Pt states that he is doing "ok with no problems"

## 2021-03-29 DIAGNOSIS — K219 Gastro-esophageal reflux disease without esophagitis: Secondary | ICD-10-CM | POA: Insufficient documentation

## 2021-03-29 NOTE — Assessment & Plan Note (Addendum)
Chronic, stable   At goal on no medication. Follow BP at home.. record blood pressure measurements and bring to office or  call with measurements  after 2 weeks.  Remain off  blood pressure medication.

## 2021-03-29 NOTE — Assessment & Plan Note (Signed)
Start  omeprazole 20 mg daily for acid reducer to help with chest pain and nausea if it return.  Avoid citrus, caffeine, soda, tomato, alcohol, peppermint, chocloate

## 2021-06-03 ENCOUNTER — Ambulatory Visit (INDEPENDENT_AMBULATORY_CARE_PROVIDER_SITE_OTHER): Payer: Medicare Other | Admitting: Family Medicine

## 2021-06-03 ENCOUNTER — Encounter: Payer: Self-pay | Admitting: Family Medicine

## 2021-06-03 VITALS — BP 130/80 | HR 74 | Temp 97.4°F | Ht 61.5 in | Wt 119.1 lb

## 2021-06-03 DIAGNOSIS — Z0000 Encounter for general adult medical examination without abnormal findings: Secondary | ICD-10-CM

## 2021-06-03 DIAGNOSIS — Z23 Encounter for immunization: Secondary | ICD-10-CM

## 2021-06-03 DIAGNOSIS — E7800 Pure hypercholesterolemia, unspecified: Secondary | ICD-10-CM

## 2021-06-03 DIAGNOSIS — I10 Essential (primary) hypertension: Secondary | ICD-10-CM

## 2021-06-03 DIAGNOSIS — R972 Elevated prostate specific antigen [PSA]: Secondary | ICD-10-CM | POA: Diagnosis not present

## 2021-06-03 NOTE — Patient Instructions (Addendum)
Please stop at the lab to have labs drawn. We will decide if you need to take the atorvastatin a few days a week or not. There are things we can do to help with side effects of atorvastatin.  Take the amlodipine benazepril daily.  Keep up the healthy eating and active lifestyle.   Please stop at the lab to have labs drawn.

## 2021-06-03 NOTE — Assessment & Plan Note (Signed)
Stable, chronic.  Continue current medication.   On amlodipine benazapril 5/10 mg daily

## 2021-06-03 NOTE — Addendum Note (Signed)
Addended by: [name] on: 06/03/2021 11:10 AM   Modules accepted: Orders

## 2021-06-03 NOTE — Progress Notes (Signed)
Patient ID: Carl Harris, male    DOB: [birth year], 70 y.o.   MRN: [id number]  This visit was conducted in person.  BP 130/80   Pulse 74   Temp (!) 97.4 F (36.3 C) (Temporal)   Ht 5' 1.5" (1.562 m)   Wt 119 lb 2 oz (54 kg)   SpO2 97%   BMI 22.14 kg/m    CC: Chief Complaint  Patient presents with   Medicare Wellness    Subjective:   HPI: Carl Harris is a 70 y.o. male presenting on 06/03/2021 for Medicare Wellness    The patient presents for annual medicare wellness, complete physical and review of chronic health problems. He/She also has the following acute concerns today:  I have personally reviewed the Medicare Annual Wellness questionnaire and have noted 1. The patient's medical and social history 2. Their use of alcohol, tobacco or illicit drugs 3. Their current medications and supplements 4. The patient's functional ability including ADL's, fall risks, home safety risks and hearing or visual             impairment. 5. Diet and physical activities 6. Evidence for depression or mood disorders 7.         Updated provider list Cognitive evaluation was performed and recorded on pt medicare questionnaire form. The patients weight, height, BMI and visual acuity have been recorded in the chart   I have made referrals, counseling and provided education to the patient based review of the above and I have provided the pt with a written personalized care plan for preventive services.    Documentation of this information was scanned into the electronic record under the media tab.   Advance directives and end of life planning reviewed in detail with patient and documented in EMR. Patient given handout on advance care directives if needed. HCPOA and living will updated if needed.  Hearing Screening  Method: Audiometry   500Hz  1000Hz  2000Hz  4000Hz   Right ear 20 20 20 20   Left ear 20 20 20 20    Vision Screening   Right eye Left eye Both eyes  Without correction      With correction 20/25 20/25 20/25    Fall Risk 09/04/2017 05/23/2019 05/30/2019 06/02/2020 06/03/2021  Falls in the past year? No 1 1 0 0  Was there an injury with Fall? - 0 0 0 -  Fall Risk Category Calculator - 1 1 0 -  Fall Risk Category - Low Low Low -  Patient Fall Risk Level - - Low fall risk - -  Fall risk Follow up - Falls evaluation completed;Falls prevention discussed - Falls evaluation completed -   [place] Office Visit from 06/03/2021 in [place] at [facility] Total Score 0      Elevated Cholesterol:   Due for re-eval  No longer taking on atorvastatin 40 mg daily.. felt bad when taking daily Using medications without problems: Muscle aches:  Diet compliance: moderate Exercise: walking Other complaints:  Hypertension:   Stable control on amlodipine benazapril 5/10 mg daily BP Readings from Last 3 Encounters:  06/03/21 130/80  03/15/21 106/72  01/25/21 120/80  Using medication without problems or lightheadedness:  none Chest pain with exertion: none Edema:none Short of breath:none Average home BPs: Other issues:      Relevant past medical, surgical, family and social history reviewed and updated as indicated. Interim medical history since our last visit reviewed. Allergies and medications reviewed and updated. Outpatient Medications Prior to  Visit  Medication Sig Dispense Refill   amLODipine-benazepril (LOTREL) 5-10 MG capsule TAKE 1 CAPSULE BY MOUTH EVERY DAY 90 capsule 1   CALCIUM PO Take by mouth.     VITAMIN D PO Take by mouth.     atorvastatin (LIPITOR) 40 MG tablet Take 1 tablet (40 mg total) by mouth daily. (Patient not taking: Reported on 06/03/2021) 90 tablet 3   No facility-administered medications prior to visit.     Per HPI unless specifically indicated in ROS section below Review of Systems Objective:  BP 130/80   Pulse 74   Temp (!) 97.4 F (36.3 C) (Temporal)   Ht 5' 1.5" (1.562 m)   Wt 119 lb 2 oz (54 kg)    SpO2 97%   BMI 22.14 kg/m   Wt Readings from Last 3 Encounters:  06/03/21 119 lb 2 oz (54 kg)  03/15/21 122 lb (55.3 kg)  03/01/21 122 lb (55.3 kg)      Physical Exam    Results for orders placed or performed in visit on 06/02/20  CBC with Differential/Platelet  Result Value Ref Range   WBC 5.2 4.0 - 10.5 K/uL   RBC 4.78 4.22 - 5.81 Mil/uL   Hemoglobin 13.7 13.0 - 17.0 g/dL   HCT 42.4 39.0 - 52.0 %   MCV 88.8 78.0 - 100.0 fl   MCHC 32.3 30.0 - 36.0 g/dL   RDW 13.5 11.5 - 15.5 %   Platelets 250.0 150.0 - 400.0 K/uL   Neutrophils Relative % 72.0 43.0 - 77.0 %   Lymphocytes Relative 18.4 12.0 - 46.0 %   Monocytes Relative 7.8 3.0 - 12.0 %   Eosinophils Relative 1.1 0.0 - 5.0 %   Basophils Relative 0.7 0.0 - 3.0 %   Neutro Abs 3.8 1.4 - 7.7 K/uL   Lymphs Abs 1.0 0.7 - 4.0 K/uL   Monocytes Absolute 0.4 0.1 - 1.0 K/uL   Eosinophils Absolute 0.1 0.0 - 0.7 K/uL   Basophils Absolute 0.0 0.0 - 0.1 K/uL  TSH  Result Value Ref Range   TSH 2.61 0.35 - 4.50 uIU/mL  Lipid panel  Result Value Ref Range   Cholesterol 232 (H) 0 - 200 mg/dL   Triglycerides 130.0 0.0 - 149.0 mg/dL   HDL 59.00 >39.00 mg/dL   VLDL 26.0 0.0 - 40.0 mg/dL   LDL Cholesterol 147 (H) 0 - 99 mg/dL   Total CHOL/HDL Ratio 4    NonHDL 173.40   Comprehensive metabolic panel  Result Value Ref Range   Sodium 138 135 - 145 mEq/L   Potassium 4.4 3.5 - 5.1 mEq/L   Chloride 105 96 - 112 mEq/L   CO2 25 19 - 32 mEq/L   Glucose, Bld 90 70 - 99 mg/dL   BUN 20 6 - 23 mg/dL   Creatinine, Ser 1.05 0.40 - 1.50 mg/dL   Total Bilirubin 0.6 0.2 - 1.2 mg/dL   Alkaline Phosphatase 71 39 - 117 U/L   AST 24 0 - 37 U/L   ALT 14 0 - 53 U/L   Total Protein 7.1 6.0 - 8.3 g/dL   Albumin 4.2 3.5 - 5.2 g/dL   GFR 73.12 >60.00 mL/min   Calcium 9.0 8.4 - 10.5 mg/dL  PSA, Medicare  Result Value Ref Range   PSA 10.00 (H) 0.10 - 4.00 ng/ml    This visit occurred during the SARS-CoV-2 public health emergency.  Safety protocols were  in place, including screening questions prior to the visit, additional usage of  staff PPE, and extensive cleaning of exam room while observing appropriate contact time as indicated for disinfecting solutions.   COVID 19 screen:  No recent travel or known exposure to COVID19 The patient denies respiratory symptoms of COVID 19 at this time. The importance of social distancing was discussed today.   Assessment and Plan The patient's preventative maintenance and recommended screening tests for an annual wellness exam were reviewed in full today. Brought up to date unless services declined.  Counselled on the importance of diet, exercise, and its role in overall health and mortality. The patient's FH and SH was reviewed, including their home life, tobacco status, and drug and alcohol status.     Due for  flu, consider shingrix, 4th COVID  Uptodate with Td. Colonoscopy 02/2021, repeat in 5 years  Nonsmoker  No ETOH                       Due for PSA check.. followed by Dr. [facility] in past.   Problem List Items Addressed This Visit     Elevated PSA   Relevant Orders   PSA, total and free   HTN (hypertension)    Stable, chronic.  Continue current medication.   On amlodipine benazapril 5/10 mg daily      HYPERCHOLESTEROLEMIA    Due for re-eval on atorvastatin 40 mg daily      Relevant Orders   Lipid panel   Comprehensive metabolic panel   Other Visit Diagnoses     Medicare annual wellness visit, subsequent    -  Primary   Need for influenza vaccination       Relevant Orders   Flu Vaccine QUAD High Dose(Fluad) (Completed)       Eliezer Lofts, MD

## 2021-06-03 NOTE — Assessment & Plan Note (Signed)
Due for re-eval on atorvastatin 40 mg daily

## 2021-06-06 LAB — COMPREHENSIVE METABOLIC PANEL
AG Ratio: 1.4 (calc) (ref 1.0–2.5)
ALT: 14 U/L (ref 9–46)
AST: 22 U/L (ref 10–35)
Albumin: 4.3 g/dL (ref 3.6–5.1)
Alkaline phosphatase (APISO): 72 U/L (ref 35–144)
BUN: 20 mg/dL (ref 7–25)
CO2: 24 mmol/L (ref 20–32)
Calcium: 9.4 mg/dL (ref 8.6–10.3)
Chloride: 104 mmol/L (ref 98–110)
Creat: 1.09 mg/dL (ref 0.70–1.35)
Globulin: 3.1 g/dL (calc) (ref 1.9–3.7)
Glucose, Bld: 99 mg/dL (ref 65–99)
Potassium: 4.8 mmol/L (ref 3.5–5.3)
Sodium: 138 mmol/L (ref 135–146)
Total Bilirubin: 0.9 mg/dL (ref 0.2–1.2)
Total Protein: 7.4 g/dL (ref 6.1–8.1)

## 2021-06-06 LAB — LIPID PANEL
Cholesterol: 290 mg/dL — ABNORMAL HIGH (ref <200)
HDL: 56 mg/dL (ref >=40)
LDL Cholesterol (Calc): 207 mg/dL (calc) — ABNORMAL HIGH
Non-HDL Cholesterol (Calc): 234 mg/dL (calc) — ABNORMAL HIGH (ref <130)
Total CHOL/HDL Ratio: 5.2 (calc) — ABNORMAL HIGH (ref <5.0)
Triglycerides: 124 mg/dL (ref <150)

## 2021-06-06 LAB — PSA, TOTAL AND FREE
PSA, Free: 2 ng/mL
PSA, Total: 14.5 ng/mL — ABNORMAL HIGH (ref <=4.0)

## 2021-06-13 NOTE — Progress Notes (Signed)
Yes... do we have a way to do certified mail or something so we know if he receives it, given the very high PSA?

## 2021-06-14 ENCOUNTER — Encounter: Payer: Self-pay | Admitting: *Deleted

## 2021-06-27 DIAGNOSIS — N401 Enlarged prostate with lower urinary tract symptoms: Secondary | ICD-10-CM | POA: Diagnosis not present

## 2021-06-27 DIAGNOSIS — R972 Elevated prostate specific antigen [PSA]: Secondary | ICD-10-CM | POA: Diagnosis not present

## 2021-06-27 DIAGNOSIS — R350 Frequency of micturition: Secondary | ICD-10-CM | POA: Diagnosis not present

## 2021-07-05 ENCOUNTER — Other Ambulatory Visit: Payer: Self-pay | Admitting: Urology

## 2021-07-05 DIAGNOSIS — R972 Elevated prostate specific antigen [PSA]: Secondary | ICD-10-CM

## 2021-08-07 ENCOUNTER — Other Ambulatory Visit: Payer: Medicare Other

## 2021-08-07 ENCOUNTER — Other Ambulatory Visit: Payer: Self-pay

## 2021-08-07 ENCOUNTER — Ambulatory Visit
Admission: RE | Admit: 2021-08-07 | Discharge: 2021-08-07 | Disposition: A | Discharge status: Home / Self Care | Payer: Medicare Other | Source: Ambulatory Visit | Attending: Urology | Admitting: Urology

## 2021-08-07 DIAGNOSIS — R972 Elevated prostate specific antigen [PSA]: Secondary | ICD-10-CM

## 2021-08-07 DIAGNOSIS — N401 Enlarged prostate with lower urinary tract symptoms: Secondary | ICD-10-CM | POA: Diagnosis not present

## 2021-08-07 MED ORDER — GADOBENATE DIMEGLUMINE 529 MG/ML IV SOLN
10.0000 mL | Freq: Once | INTRAVENOUS | Status: AC | PRN
  Administered 2021-08-07: 10 mL via INTRAVENOUS

## 2021-08-18 DIAGNOSIS — N401 Enlarged prostate with lower urinary tract symptoms: Secondary | ICD-10-CM | POA: Diagnosis not present

## 2021-08-18 DIAGNOSIS — R972 Elevated prostate specific antigen [PSA]: Secondary | ICD-10-CM | POA: Diagnosis not present

## 2021-08-18 DIAGNOSIS — R3912 Poor urinary stream: Secondary | ICD-10-CM | POA: Diagnosis not present

## 2021-08-19 ENCOUNTER — Other Ambulatory Visit: Payer: Self-pay | Admitting: Family Medicine

## 2021-08-23 ENCOUNTER — Other Ambulatory Visit: Payer: Self-pay | Admitting: Gastroenterology

## 2021-10-13 ENCOUNTER — Other Ambulatory Visit: Payer: Self-pay | Admitting: Family Medicine

## 2021-11-13 ENCOUNTER — Other Ambulatory Visit: Payer: Self-pay | Admitting: Family Medicine

## 2021-11-23 ENCOUNTER — Other Ambulatory Visit: Payer: Self-pay | Admitting: Family Medicine

## 2022-02-16 DIAGNOSIS — R972 Elevated prostate specific antigen [PSA]: Secondary | ICD-10-CM | POA: Diagnosis not present

## 2022-05-14 ENCOUNTER — Other Ambulatory Visit: Payer: Self-pay | Admitting: Family Medicine

## 2022-05-20 ENCOUNTER — Other Ambulatory Visit: Payer: Self-pay | Admitting: Family Medicine

## 2022-06-06 ENCOUNTER — Encounter: Payer: Medicare Other | Admitting: Family Medicine

## 2022-06-07 ENCOUNTER — Ambulatory Visit (INDEPENDENT_AMBULATORY_CARE_PROVIDER_SITE_OTHER): Payer: Medicare Other

## 2022-06-07 VITALS — Ht 63.0 in | Wt 125.0 lb

## 2022-06-07 DIAGNOSIS — Z0000 Encounter for general adult medical examination without abnormal findings: Secondary | ICD-10-CM

## 2022-06-07 NOTE — Patient Instructions (Addendum)
Carl Harris , Thank you for taking time to come for your Medicare Wellness Visit. I appreciate your ongoing commitment to your health goals. Please review the following plan we discussed and let me know if I can assist you in the future.   These are the goals we discussed:  Goals       Patient Stated      05/23/2019, I will maintain and continue exercising and eating healthy.       Stay Healthy (pt-stated)        This is a list of the screening recommended for you and due dates:  Health Maintenance  Topic Date Due   COVID-19 Vaccine (4 - 2023-24 season) 06/23/2022*   Flu Shot  10/15/2022*   Stool Blood Test  12/06/2022*   Medicare Annual Wellness Visit  06/08/2023   Colon Cancer Screening  03/15/2026   Pneumonia Vaccine  Completed   Hepatitis C Screening: USPSTF Recommendation to screen - Ages 18-79 yo.  Completed   Zoster (Shingles) Vaccine  Completed   HPV Vaccine  Aged Out  *Topic was postponed. The date shown is not the original due date.    Advanced directives: Advance directive discussed with you today. Even though you declined this today, please call our office should you change your mind, and we can give you the proper paperwork for you to fill out.   Conditions/risks identified: None  Next appointment: Follow up in one year for your annual wellness visit.    Preventive Care 52 Years and Older, Male  Preventive care refers to lifestyle choices and visits with your health care provider that can promote health and wellness. What does preventive care include? A yearly physical exam. This is also called an annual well check. Dental exams once or twice a year. Routine eye exams. Ask your health care provider how often you should have your eyes checked. Personal lifestyle choices, including: Daily care of your teeth and gums. Regular physical activity. Eating a healthy diet. Avoiding tobacco and drug use. Limiting alcohol use. Practicing safe sex. Taking low doses of  aspirin every day. Taking vitamin and mineral supplements as recommended by your health care provider. What happens during an annual well check? The services and screenings done by your health care provider during your annual well check will depend on your age, overall health, lifestyle risk factors, and family history of disease. Counseling  Your health care provider may ask you questions about your: Alcohol use. Tobacco use. Drug use. Emotional well-being. Home and relationship well-being. Sexual activity. Eating habits. History of falls. Memory and ability to understand (cognition). Work and work Statistician. Screening  You may have the following tests or measurements: Height, weight, and BMI. Blood pressure. Lipid and cholesterol levels. These may be checked every 5 years, or more frequently if you are over 40 years old. Skin check. Lung cancer screening. You may have this screening every year starting at age 53 if you have a 30-pack-year history of smoking and currently smoke or have quit within the past 15 years. Fecal occult blood test (FOBT) of the stool. You may have this test every year starting at age 73. Flexible sigmoidoscopy or colonoscopy. You may have a sigmoidoscopy every 5 years or a colonoscopy every 10 years starting at age 79. Prostate cancer screening. Recommendations will vary depending on your family history and other risks. Hepatitis C blood test. Hepatitis B blood test. Sexually transmitted disease (STD) testing. Diabetes screening. This is done by checking your blood  sugar (glucose) after you have not eaten for a while (fasting). You may have this done every 1-3 years. Abdominal aortic aneurysm (AAA) screening. You may need this if you are a current or former smoker. Osteoporosis. You may be screened starting at age 5 if you are at high risk. Talk with your health care provider about your test results, treatment options, and if necessary, the need for more  tests. Vaccines  Your health care provider may recommend certain vaccines, such as: Influenza vaccine. This is recommended every year. Tetanus, diphtheria, and acellular pertussis (Tdap, Td) vaccine. You may need a Td booster every 10 years. Zoster vaccine. You may need this after age 42. Pneumococcal 13-valent conjugate (PCV13) vaccine. One dose is recommended after age 43. Pneumococcal polysaccharide (PPSV23) vaccine. One dose is recommended after age 26. Talk to your health care provider about which screenings and vaccines you need and how often you need them. This information is not intended to replace advice given to you by your health care provider. Make sure you discuss any questions you have with your health care provider. Document Released: 07/30/2015 Document Revised: 03/22/2016 Document Reviewed: 05/04/2015 Elsevier Interactive Patient Education  2017 [place] Prevention in the Home Falls can cause injuries. They can happen to people of all ages. There are many things you can do to make your home safe and to help prevent falls. What can I do on the outside of my home? Regularly fix the edges of walkways and driveways and fix any cracks. Remove anything that might make you trip as you walk through a door, such as a raised step or threshold. Trim any bushes or trees on the path to your home. Use bright outdoor lighting. Clear any walking paths of anything that might make someone trip, such as rocks or tools. Regularly check to see if handrails are loose or broken. Make sure that both sides of any steps have handrails. Any raised decks and porches should have guardrails on the edges. Have any leaves, snow, or ice cleared regularly. Use sand or salt on walking paths during winter. Clean up any spills in your garage right away. This includes oil or grease spills. What can I do in the bathroom? Use night lights. Install grab bars by the toilet and in the tub and shower.  Do not use towel bars as grab bars. Use non-skid mats or decals in the tub or shower. If you need to sit down in the shower, use a plastic, non-slip stool. Keep the floor dry. Clean up any water that spills on the floor as soon as it happens. Remove soap buildup in the tub or shower regularly. Attach bath mats securely with double-sided non-slip rug tape. Do not have throw rugs and other things on the floor that can make you trip. What can I do in the bedroom? Use night lights. Make sure that you have a light by your bed that is easy to reach. Do not use any sheets or blankets that are too big for your bed. They should not hang down onto the floor. Have a firm chair that has side arms. You can use this for support while you get dressed. Do not have throw rugs and other things on the floor that can make you trip. What can I do in the kitchen? Clean up any spills right away. Avoid walking on wet floors. Keep items that you use a lot in easy-to-reach places. If you need to reach something above you,  use a strong step stool that has a grab bar. Keep electrical cords out of the way. Do not use floor polish or wax that makes floors slippery. If you must use wax, use non-skid floor wax. Do not have throw rugs and other things on the floor that can make you trip. What can I do with my stairs? Do not leave any items on the stairs. Make sure that there are handrails on both sides of the stairs and use them. Fix handrails that are broken or loose. Make sure that handrails are as long as the stairways. Check any carpeting to make sure that it is firmly attached to the stairs. Fix any carpet that is loose or worn. Avoid having throw rugs at the top or bottom of the stairs. If you do have throw rugs, attach them to the floor with carpet tape. Make sure that you have a light switch at the top of the stairs and the bottom of the stairs. If you do not have them, ask someone to add them for you. What else  can I do to help prevent falls? Wear shoes that: Do not have high heels. Have rubber bottoms. Are comfortable and fit you well. Are closed at the toe. Do not wear sandals. If you use a stepladder: Make sure that it is fully opened. Do not climb a closed stepladder. Make sure that both sides of the stepladder are locked into place. Ask someone to hold it for you, if possible. Clearly mark and make sure that you can see: Any grab bars or handrails. First and last steps. Where the edge of each step is. Use tools that help you move around (mobility aids) if they are needed. These include: Canes. Walkers. Scooters. Crutches. Turn on the lights when you go into a dark area. Replace any light bulbs as soon as they burn out. Set up your furniture so you have a clear path. Avoid moving your furniture around. If any of your floors are uneven, fix them. If there are any pets around you, be aware of where they are. Review your medicines with your doctor. Some medicines can make you feel dizzy. This can increase your chance of falling. Ask your doctor what other things that you can do to help prevent falls. This information is not intended to replace advice given to you by your health care provider. Make sure you discuss any questions you have with your health care provider. Document Released: 04/29/2009 Document Revised: 12/09/2015 Document Reviewed: 08/07/2014 Elsevier Interactive Patient Education  2017 [employer].
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
## 2022-06-14 ENCOUNTER — Ambulatory Visit (INDEPENDENT_AMBULATORY_CARE_PROVIDER_SITE_OTHER): Payer: Medicare Other | Admitting: Family Medicine

## 2022-06-14 ENCOUNTER — Encounter: Payer: Self-pay | Admitting: Family Medicine

## 2022-06-14 VITALS — BP 138/80 | HR 74 | Temp 97.3°F | Ht 63.0 in | Wt 118.0 lb

## 2022-06-14 DIAGNOSIS — F5104 Psychophysiologic insomnia: Secondary | ICD-10-CM

## 2022-06-14 DIAGNOSIS — Z23 Encounter for immunization: Secondary | ICD-10-CM | POA: Diagnosis not present

## 2022-06-14 DIAGNOSIS — Z0000 Encounter for general adult medical examination without abnormal findings: Secondary | ICD-10-CM | POA: Diagnosis not present

## 2022-06-14 DIAGNOSIS — N401 Enlarged prostate with lower urinary tract symptoms: Secondary | ICD-10-CM

## 2022-06-14 DIAGNOSIS — R350 Frequency of micturition: Secondary | ICD-10-CM

## 2022-06-14 DIAGNOSIS — I10 Essential (primary) hypertension: Secondary | ICD-10-CM

## 2022-06-14 DIAGNOSIS — R972 Elevated prostate specific antigen [PSA]: Secondary | ICD-10-CM

## 2022-06-14 DIAGNOSIS — E7800 Pure hypercholesterolemia, unspecified: Secondary | ICD-10-CM | POA: Diagnosis not present

## 2022-06-14 LAB — COMPREHENSIVE METABOLIC PANEL
ALT: 22 U/L (ref 0–53)
AST: 27 U/L (ref 0–37)
Albumin: 4.4 g/dL (ref 3.5–5.2)
Alkaline Phosphatase: 68 U/L (ref 39–117)
BUN: 26 mg/dL — ABNORMAL HIGH (ref 6–23)
CO2: 27 mEq/L (ref 19–32)
Calcium: 9.1 mg/dL (ref 8.4–10.5)
Chloride: 106 mEq/L (ref 96–112)
Creatinine, Ser: 1.11 mg/dL (ref 0.40–1.50)
GFR: 67.43 mL/min (ref >60.00)
Glucose, Bld: 90 mg/dL (ref 70–99)
Potassium: 4.1 mEq/L (ref 3.5–5.1)
Sodium: 140 mEq/L (ref 135–145)
Total Bilirubin: 0.4 mg/dL (ref 0.2–1.2)
Total Protein: 7.6 g/dL (ref 6.0–8.3)

## 2022-06-14 LAB — LIPID PANEL
Cholesterol: 161 mg/dL (ref 0–200)
HDL: 58 mg/dL (ref >39.00)
LDL Cholesterol: 84 mg/dL (ref 0–99)
NonHDL: 102.51
Total CHOL/HDL Ratio: 3
Triglycerides: 93 mg/dL (ref 0.0–149.0)
VLDL: 18.6 mg/dL (ref 0.0–40.0)

## 2022-06-14 NOTE — Progress Notes (Signed)
Patient ID: Carl Harris, male    DOB: 1950-08-05, 71 y.o.   MRN: [id number]  This visit was conducted in person.  BP 138/80 (BP Location: Left Arm, Patient Position: Sitting, Cuff Size: Normal)   Pulse 74   Temp (!) 97.3 F (36.3 C) (Temporal)   Ht '5\' 3"'$  (1.6 m)   Wt 118 lb (53.5 kg)   SpO2 98%   BMI 20.90 kg/m    CC:  Chief Complaint  Patient presents with   Medicare Wellness    Part 2    Subjective:   HPI: Carl Harris is a 71 y.o. male presenting on 06/14/2022 for Medicare Wellness (Part 2)  The patient presents for complete physical and review of chronic health problems. He/She also has the following acute concerns today:  none  The patient saw a LPN or RN for medicare wellness visit.  Prevention and wellness was reviewed in detail. Note reviewed and important notes copied below.     Elevated Cholesterol: Due for reevaluation on atorvastatin 40 mg daily Lab Results  Component Value Date   CHOL 290 (H) 06/03/2021   HDL 56 06/03/2021   LDLCALC 207 (H) 06/03/2021   LDLDIRECT 228.5 06/24/2013   TRIG 124 06/03/2021   CHOLHDL 5.2 (H) 06/03/2021  Using medications without problems: Muscle aches:  Diet compliance: heart healthy Exercise: Daily exercise daily. Other complaints:  Hypertension:  Well-controlled on amlodipine benazepril 5/10 mg p.o. daily BP Readings from Last 3 Encounters:  06/14/22 138/80  06/03/21 130/80  03/15/21 106/72  Using medication without problems or lightheadedness:  none Chest pain with exertion: none Edema:none Short of breath: Average home BPs: Other issues:    Chronic insomnia: Trouble sleeping for years.  Relevant past medical, surgical, family and social history reviewed and updated as indicated. Interim medical history since our last visit reviewed. Allergies and medications reviewed and updated. Outpatient Medications Prior to Visit  Medication Sig Dispense Refill   amLODipine-benazepril (LOTREL) 5-10 MG capsule  TAKE 1 CAPSULE BY MOUTH EVERY DAY 90 capsule 0   atorvastatin (LIPITOR) 40 MG tablet TAKE 1 TABLET BY MOUTH EVERY DAY 90 tablet 0   CALCIUM PO Take by mouth.     VITAMIN D PO Take by mouth.     No facility-administered medications prior to visit.     Per HPI unless specifically indicated in ROS section below Review of Systems  Constitutional:  Negative for fatigue and fever.  HENT:  Negative for ear pain.   Eyes:  Negative for pain.  Respiratory:  Negative for cough and shortness of breath.   Cardiovascular:  Negative for chest pain, palpitations and leg swelling.  Gastrointestinal:  Negative for abdominal pain.  Genitourinary:  Negative for dysuria.  Musculoskeletal:  Negative for arthralgias.  Neurological:  Negative for syncope, light-headedness and headaches.  Psychiatric/Behavioral:  Negative for dysphoric mood.    Objective:  BP 138/80 (BP Location: Left Arm, Patient Position: Sitting, Cuff Size: Normal)   Pulse 74   Temp (!) 97.3 F (36.3 C) (Temporal)   Ht '5\' 3"'$  (1.6 m)   Wt 118 lb (53.5 kg)   SpO2 98%   BMI 20.90 kg/m   Wt Readings from Last 3 Encounters:  06/14/22 118 lb (53.5 kg)  06/07/22 125 lb (56.7 kg)  06/03/21 119 lb 2 oz (54 kg)      Physical Exam Constitutional:      General: He is not in acute distress.    Appearance: Normal appearance. He  is well-developed. He is not ill-appearing or toxic-appearing.  HENT:     Head: Normocephalic and atraumatic.     Right Ear: Hearing, tympanic membrane, ear canal and external ear normal.     Left Ear: Hearing, tympanic membrane, ear canal and external ear normal.     Nose: Nose normal.     Mouth/Throat:     Pharynx: Uvula midline.  Eyes:     General: Lids are normal. Lids are everted, no foreign bodies appreciated.     Conjunctiva/sclera: Conjunctivae normal.     Pupils: Pupils are equal, round, and reactive to light.  Neck:     Thyroid: No thyroid mass or thyromegaly.     Vascular: No carotid bruit.      Trachea: Trachea and phonation normal.  Cardiovascular:     Rate and Rhythm: Normal rate and regular rhythm.     Pulses: Normal pulses.     Heart sounds: S1 normal and S2 normal. No murmur heard.    No gallop.  Pulmonary:     Breath sounds: Normal breath sounds. No wheezing, rhonchi or rales.  Abdominal:     General: Bowel sounds are normal.     Palpations: Abdomen is soft.     Tenderness: There is no abdominal tenderness. There is no guarding or rebound.     Hernia: No hernia is present.  Musculoskeletal:     Cervical back: Normal range of motion and neck supple.  Lymphadenopathy:     Cervical: No cervical adenopathy.  Skin:    General: Skin is warm and dry.     Findings: No rash.  Neurological:     Mental Status: He is alert.     Cranial Nerves: No cranial nerve deficit.     Sensory: No sensory deficit.     Gait: Gait normal.     Deep Tendon Reflexes: Reflexes are normal and symmetric.  Psychiatric:        Speech: Speech normal.        Behavior: Behavior normal.        Judgment: Judgment normal.       Results for orders placed or performed in visit on 06/03/21  Comprehensive metabolic panel  Result Value Ref Range   Glucose, Bld 99 65 - 99 mg/dL   BUN 20 7 - 25 mg/dL   Creat 1.09 0.70 - 1.35 mg/dL   BUN/Creatinine Ratio NOT APPLICABLE 6 - 22 (calc)   Sodium 138 135 - 146 mmol/L   Potassium 4.8 3.5 - 5.3 mmol/L   Chloride 104 98 - 110 mmol/L   CO2 24 20 - 32 mmol/L   Calcium 9.4 8.6 - 10.3 mg/dL   Total Protein 7.4 6.1 - 8.1 g/dL   Albumin 4.3 3.6 - 5.1 g/dL   Globulin 3.1 1.9 - 3.7 g/dL (calc)   AG Ratio 1.4 1.0 - 2.5 (calc)   Total Bilirubin 0.9 0.2 - 1.2 mg/dL   Alkaline phosphatase (APISO) 72 35 - 144 U/L   AST 22 10 - 35 U/L   ALT 14 9 - 46 U/L  Lipid panel  Result Value Ref Range   Cholesterol 290 (H) <200 mg/dL   HDL 56 > OR = 40 mg/dL   Triglycerides 124 <150 mg/dL   LDL Cholesterol (Calc) 207 (H) mg/dL (calc)   Total CHOL/HDL Ratio 5.2 (H) <5.0  (calc)   Non-HDL Cholesterol (Calc) 234 (H) <130 mg/dL (calc)  PSA, total and free  Result Value Ref Range   PSA, Total  14.5 (H) < OR = 4.0 ng/mL   PSA, Free 2.0 ng/mL   PSA, % Free NOT CALCULATED >25 % (calc)     COVID 19 screen:  No recent travel or known exposure to COVID19 The patient denies respiratory symptoms of COVID 19 at this time. The importance of social distancing was discussed today.   Assessment and Plan   The patient's preventative maintenance and recommended screening tests for an annual wellness exam were reviewed in full today. Brought up to date unless services declined.  Counselled on the importance of diet, exercise, and its role in overall health and mortality. The patient's FH and SH was reviewed, including their home life, tobacco status, and drug and alcohol status.   given flu vaccine, completed PNA series, shingrix, 4th COVID  Uptodate with Td. Colonoscopy 02/2021, repeat in 5 years  Nonsmoker  Hep C: done  No ETOH                      PSA elevated.. followed by [facility].. reviewed OV note from 08/19/2021 Started on finasteride.  Given low risk prostate MRI and negative biopsies.. plan q 6 month PSA.  Problem List Items Addressed This Visit     BPH (benign prostatic hyperplasia)     Given finasteride, he did not take as symptoms were not to bad. Per Urology, Dr. winter      Chronic insomnia   Elevated PSA    PSA elevated.. followed by [facility].. reviewed OV note from 08/19/2021 Started on finasteride.  Given low risk prostate MRI and negative biopsies.. plan q 6 month PSA.      HTN (hypertension)    Stable, chronic.  Continue current medication.  Well-controlled on amlodipine benazepril 5/10 mg p.o. daily      HYPERCHOLESTEROLEMIA    Due for reevaluation on atorvastatin 40 mg daily      Relevant Orders   Lipid panel (Completed)   Comprehensive metabolic panel (Completed)   Other Visit Diagnoses     Routine general  medical examination at a health care facility    -  Primary   Need for immunization against influenza       Relevant Orders   Flu Vaccine QUAD High Dose(Fluad) (Completed)      Orders Placed This Encounter  Procedures   Flu Vaccine QUAD High Dose(Fluad)   Lipid panel   Comprehensive metabolic panel     Eliezer Lofts, MD

## 2022-06-14 NOTE — Assessment & Plan Note (Addendum)
Given finasteride, he did not take as symptoms were not to bad. Per Urology, Dr. Lovena Neighbours

## 2022-06-14 NOTE — Assessment & Plan Note (Addendum)
Stable, chronic.  Continue current medication.  Well-controlled on amlodipine benazepril 5/10 mg p.o. daily

## 2022-06-14 NOTE — Assessment & Plan Note (Signed)
PSA elevated.. followed by [facility].. reviewed OV note from 08/19/2021 Started on finasteride.  Given low risk prostate MRI and negative biopsies.. plan q 6 month PSA.

## 2022-06-14 NOTE — Patient Instructions (Signed)
Please stop at the lab to have labs drawn.  

## 2022-07-19 NOTE — Assessment & Plan Note (Signed)
Chronic.Marland Kitchen  Counseled on healthy sleep hygiene habits.  Discussed sleep retraining protocol.  Information provided to patient.

## 2022-07-19 NOTE — Assessment & Plan Note (Signed)
Due for reevaluation on atorvastatin 40 mg daily

## 2022-08-06 ENCOUNTER — Other Ambulatory Visit: Payer: Self-pay | Admitting: Family Medicine

## 2022-08-16 ENCOUNTER — Other Ambulatory Visit: Payer: Self-pay | Admitting: Family Medicine

## 2023-06-21 ENCOUNTER — Encounter: Payer: Self-pay | Admitting: Family Medicine

## 2023-06-21 ENCOUNTER — Ambulatory Visit (INDEPENDENT_AMBULATORY_CARE_PROVIDER_SITE_OTHER): Payer: Medicare Other | Admitting: Family Medicine

## 2023-06-21 VITALS — BP 116/80 | HR 68 | Temp 98.3°F | Ht 62.0 in | Wt 116.4 lb

## 2023-06-21 DIAGNOSIS — R350 Frequency of micturition: Secondary | ICD-10-CM

## 2023-06-21 DIAGNOSIS — R3915 Urgency of urination: Secondary | ICD-10-CM

## 2023-06-21 DIAGNOSIS — Z0000 Encounter for general adult medical examination without abnormal findings: Secondary | ICD-10-CM | POA: Diagnosis not present

## 2023-06-21 DIAGNOSIS — I10 Essential (primary) hypertension: Secondary | ICD-10-CM

## 2023-06-21 DIAGNOSIS — R972 Elevated prostate specific antigen [PSA]: Secondary | ICD-10-CM

## 2023-06-21 DIAGNOSIS — R0789 Other chest pain: Secondary | ICD-10-CM | POA: Diagnosis not present

## 2023-06-21 DIAGNOSIS — E7800 Pure hypercholesterolemia, unspecified: Secondary | ICD-10-CM

## 2023-06-21 DIAGNOSIS — N401 Enlarged prostate with lower urinary tract symptoms: Secondary | ICD-10-CM

## 2023-06-21 DIAGNOSIS — Z23 Encounter for immunization: Secondary | ICD-10-CM | POA: Diagnosis not present

## 2023-06-21 LAB — COMPREHENSIVE METABOLIC PANEL
ALT: 33 U/L (ref 0–53)
AST: 30 U/L (ref 0–37)
Albumin: 4.5 g/dL (ref 3.5–5.2)
Alkaline Phosphatase: 70 U/L (ref 39–117)
BUN: 26 mg/dL — ABNORMAL HIGH (ref 6–23)
CO2: 28 meq/L (ref 19–32)
Calcium: 9.3 mg/dL (ref 8.4–10.5)
Chloride: 105 meq/L (ref 96–112)
Creatinine, Ser: 1.04 mg/dL (ref 0.40–1.50)
GFR: 72.4 mL/min (ref >60.00)
Glucose, Bld: 93 mg/dL (ref 70–99)
Potassium: 4.3 meq/L (ref 3.5–5.1)
Sodium: 141 meq/L (ref 135–145)
Total Bilirubin: 1 mg/dL (ref 0.2–1.2)
Total Protein: 7.5 g/dL (ref 6.0–8.3)

## 2023-06-21 LAB — LIPID PANEL
Cholesterol: 185 mg/dL (ref 0–200)
HDL: 55.3 mg/dL (ref >39.00)
LDL Cholesterol: 112 mg/dL — ABNORMAL HIGH (ref 0–99)
NonHDL: 130.06
Total CHOL/HDL Ratio: 3
Triglycerides: 90 mg/dL (ref 0.0–149.0)
VLDL: 18 mg/dL (ref 0.0–40.0)

## 2023-06-21 LAB — POC URINALSYSI DIPSTICK (AUTOMATED)
Bilirubin, UA: NEGATIVE
Blood, UA: NEGATIVE
Glucose, UA: NEGATIVE
Ketones, UA: NEGATIVE
Leukocytes, UA: NEGATIVE
Nitrite, UA: NEGATIVE
Protein, UA: NEGATIVE
Spec Grav, UA: 1.025 (ref 1.010–1.025)
Urobilinogen, UA: 0.2 U/dL
pH, UA: 6 (ref 5.0–8.0)

## 2023-06-21 LAB — PSA, MEDICARE: PSA: 13.02 ng/mL — ABNORMAL HIGH (ref 0.10–4.00)

## 2023-06-21 MED ORDER — FINASTERIDE 5 MG PO TABS: 5.0000 mg | ORAL_TABLET | Freq: Every day | ORAL | 11 refills | Status: DC

## 2023-06-21 MED ORDER — ATORVASTATIN CALCIUM 40 MG PO TABS: 40.0000 mg | ORAL_TABLET | Freq: Every day | ORAL | 3 refills | Status: AC

## 2023-06-21 MED ORDER — AMLODIPINE BESY-BENAZEPRIL HCL 5-10 MG PO CAPS: 1.0000 | ORAL_CAPSULE | Freq: Every day | ORAL | 3 refills | Status: AC

## 2023-06-21 NOTE — Patient Instructions (Addendum)
Please stop at the lab to have labs drawn.  Start finasteride 5 mg daily.  We will plan to check PSA every 6 months.  If chest pain recurs.. call for re-eval and likely referral to cardiology for cardiac evaluation.

## 2023-06-21 NOTE — Assessment & Plan Note (Signed)
Chronic, good control on current regimen.  Atorvastatin 40 mg p.o. daily

## 2023-06-21 NOTE — Assessment & Plan Note (Addendum)
Chronic, he has not seen [facility] since February 2023. Due for PSA.  Will plan to check every 2-month for stability given low risk prostate MRI and negative biopsies.  If PSA trending up will refer back to urology.

## 2023-06-21 NOTE — Assessment & Plan Note (Signed)
Stable, chronic.  Continue current medication.  Well-controlled on amlodipine benazepril 5/10 mg p.o. daily

## 2023-06-21 NOTE — Assessment & Plan Note (Signed)
Acute... Most likely noncardiac chest pain but given patient's increased risk with father, high cholesterol, male gender and age will evaluate with EKG in office today. If unremarkable we will follow over time and he will return if symptoms recur. Will move forward with stress testing if EKG changing.  Addendum: EKG: normal EKG, normal sinus rhythm, unchanged from previous tracing 2019.

## 2023-06-21 NOTE — Progress Notes (Signed)
Patient ID: Carl Harris, male    DOB: 26-Oct-1950, 72 y.o.   MRN: [id number]  This visit was conducted in person.  BP 116/80 (BP Location: Left Arm, Patient Position: Sitting, Cuff Size: Normal)   Pulse 68   Temp 98.3 F (36.8 C) (Temporal)   Ht 5\' 2"  (1.575 m)   Wt 116 lb 6 oz (52.8 kg)   SpO2 97%   BMI 21.29 kg/m    CC:  Chief Complaint  Patient presents with   Annual Exam    MWV scheduled for 07/13/23    Subjective:   HPI: Carl Harris is a 72 y.o. male presenting on 06/21/2023 for Annual Exam (MWV scheduled for 07/13/23)  The patient presents for complete physical and review of chronic health problems. He/She also has the following acute concerns today:   In last year has noted 2  episodes on chest tightness at rest, no associated SOb, sweating or dizziness.  Once on left chest and once on right... felt likw cramping.. better with massage of chest wall and time.  No chest pain with exertion.  Father with MI age 29  Annual Medicare wellness scheduled for July 13, 2023     Elevated Cholesterol: Good control on atorvastatin 40 mg daily Lab Results  Component Value Date   CHOL 161 06/14/2022   HDL 58.00 06/14/2022   LDLCALC 84 06/14/2022   LDLDIRECT 228.5 06/24/2013   TRIG 93.0 06/14/2022   CHOLHDL 3 06/14/2022  Using medications without problems: Muscle aches:  Diet compliance: heart healthy Exercise: Daily exercise daily. Other complaints:  Hypertension:  Well-controlled on amlodipine benazepril 5/10 mg p.o. daily BP Readings from Last 3 Encounters:  06/21/23 116/80  06/14/22 138/80  06/03/21 130/80  Using medication without problems or lightheadedness:  none Chest pain with exertion: none Edema:none Short of breath: Average home BPs: Other issues:    Chronic insomnia: Trouble sleeping for years.   BPH: He does have urine frequency many times a day, only once at night.  Has urgency and and occ incontinence.   Relevant past medical,  surgical, family and social history reviewed and updated as indicated. Interim medical history since our last visit reviewed. Allergies and medications reviewed and updated. Outpatient Medications Prior to Visit  Medication Sig Dispense Refill   VITAMIN D PO Take by mouth.     amLODipine-benazepril (LOTREL) 5-10 MG capsule TAKE 1 CAPSULE BY MOUTH EVERY DAY 90 capsule 3   atorvastatin (LIPITOR) 40 MG tablet TAKE 1 TABLET BY MOUTH EVERY DAY 90 tablet 3   CALCIUM PO Take by mouth.     No facility-administered medications prior to visit.     Per HPI unless specifically indicated in ROS section below Review of Systems  Constitutional:  Negative for fatigue and fever.  HENT:  Negative for ear pain.   Eyes:  Negative for pain.  Respiratory:  Negative for cough and shortness of breath.   Cardiovascular:  Negative for chest pain, palpitations and leg swelling.  Gastrointestinal:  Negative for abdominal pain.  Genitourinary:  Negative for dysuria.  Musculoskeletal:  Negative for arthralgias.  Neurological:  Negative for syncope, light-headedness and headaches.  Psychiatric/Behavioral:  Negative for dysphoric mood.    Objective:  BP 116/80 (BP Location: Left Arm, Patient Position: Sitting, Cuff Size: Normal)   Pulse 68   Temp 98.3 F (36.8 C) (Temporal)   Ht 5\' 2"  (1.575 m)   Wt 116 lb 6 oz (52.8 kg)   SpO2 97%  BMI 21.29 kg/m   Wt Readings from Last 3 Encounters:  06/21/23 116 lb 6 oz (52.8 kg)  06/14/22 118 lb (53.5 kg)  06/07/22 125 lb (56.7 kg)      Physical Exam Constitutional:      General: He is not in acute distress.    Appearance: Normal appearance. He is well-developed. He is not ill-appearing or toxic-appearing.  HENT:     Head: Normocephalic and atraumatic.     Right Ear: Hearing, tympanic membrane, ear canal and external ear normal.     Left Ear: Hearing, tympanic membrane, ear canal and external ear normal.     Nose: Nose normal.     Mouth/Throat:     Pharynx:  Uvula midline.  Eyes:     General: Lids are normal. Lids are everted, no foreign bodies appreciated.     Conjunctiva/sclera: Conjunctivae normal.     Pupils: Pupils are equal, round, and reactive to light.  Neck:     Thyroid: No thyroid mass or thyromegaly.     Vascular: No carotid bruit.     Trachea: Trachea and phonation normal.  Cardiovascular:     Rate and Rhythm: Normal rate and regular rhythm.     Pulses: Normal pulses.     Heart sounds: S1 normal and S2 normal. No murmur heard.    No gallop.  Pulmonary:     Breath sounds: Normal breath sounds. No wheezing, rhonchi or rales.  Abdominal:     General: Bowel sounds are normal.     Palpations: Abdomen is soft.     Tenderness: There is no abdominal tenderness. There is no guarding or rebound.     Hernia: No hernia is present.  Musculoskeletal:     Cervical back: Normal range of motion and neck supple.  Lymphadenopathy:     Cervical: No cervical adenopathy.  Skin:    General: Skin is warm and dry.     Findings: No rash.  Neurological:     Mental Status: He is alert.     Cranial Nerves: No cranial nerve deficit.     Sensory: No sensory deficit.     Gait: Gait normal.     Deep Tendon Reflexes: Reflexes are normal and symmetric.  Psychiatric:        Speech: Speech normal.        Behavior: Behavior normal.        Judgment: Judgment normal.       Results for orders placed or performed in visit on 06/21/23  POCT Urinalysis Dipstick (Automated)  Result Value Ref Range   Color, UA Yellow    Clarity, UA Clear    Glucose, UA Negative Negative   Bilirubin, UA Negative    Ketones, UA Negative    Spec Grav, UA 1.025 1.010 - 1.025   Blood, UA Negative    pH, UA 6.0 5.0 - 8.0   Protein, UA Negative Negative   Urobilinogen, UA 0.2 0.2 or 1.0 E.U./dL   Nitrite, UA Negative    Leukocytes, UA Negative Negative     COVID 19 screen:  No recent travel or known exposure to COVID19 The patient denies respiratory symptoms of COVID  19 at this time. The importance of social distancing was discussed today.   Assessment and Plan   The patient's preventative maintenance and recommended screening tests for an annual wellness exam were reviewed in full today. Brought up to date unless services declined.  Counselled on the importance of diet, exercise, and its role  in overall health and mortality. The patient's FH and SH was reviewed, including their home life, tobacco status, and drug and alcohol status.   Given flu vaccine, completed PNA series, shingrix, 4th COVID  Uptodate with Td. Colonoscopy 02/2021, repeat in 5 years  Nonsmoker  Hep C: done  No ETOH                      PSA elevated in past .. followed by [facility].. reviewed OV note from 08/19/2021..  Given negative past prostate biopsy and largely normal MRI prostate decision was made to follow PSA every 6 months, started on finasteride 5 mg once daily.... he states he never took and was unable to return. Lab Results  Component Value Date   PSA 10.00 (H) 06/02/2020   PSA 11.31 (H) 09/02/2019   PSA 8.53 (H) 05/23/2019    Given low risk prostate MRI and negative biopsies.. plan q 6 month PSA.  Problem List Items Addressed This Visit     Atypical chest pain    Acute... Most likely noncardiac chest pain but given patient's increased risk with father, high cholesterol, male gender and age will evaluate with EKG in office today. If unremarkable we will follow over time and he will return if symptoms recur. Will move forward with stress testing if EKG changing.  Addendum: EKG: normal EKG, normal sinus rhythm, unchanged from previous tracing 2019.       Relevant Orders   EKG 12-Lead (Completed)   BPH (benign prostatic hyperplasia)    Chronic, stable control   Start Finasteride 5 mg daily..      Relevant Medications   finasteride (PROSCAR) 5 MG tablet   Elevated PSA    Chronic, he has not seen [facility] since February 2023. Due for PSA.   Will plan to check every 11-month for stability given low risk prostate MRI and negative biopsies.  If PSA trending up will refer back to urology.      Relevant Orders   PSA, Medicare   HTN (hypertension)    Stable, chronic.  Continue current medication.  Well-controlled on amlodipine/benazepril 5/10 mg p.o. daily      Relevant Medications   amLODipine-benazepril (LOTREL) 5-10 MG capsule   atorvastatin (LIPITOR) 40 MG tablet   HYPERCHOLESTEROLEMIA    Chronic, good control on current regimen.  Atorvastatin 40 mg p.o. daily      Relevant Medications   amLODipine-benazepril (LOTREL) 5-10 MG capsule   atorvastatin (LIPITOR) 40 MG tablet   Other Relevant Orders   Lipid panel   Comprehensive metabolic panel   Other Visit Diagnoses     Routine general medical examination at a health care facility    -  Primary   Need for influenza vaccination       Relevant Orders   Flu Vaccine Trivalent High Dose (Fluad) (Completed)   Urinary urgency       Relevant Orders   POCT Urinalysis Dipstick (Automated) (Completed)      Orders Placed This Encounter  Procedures   Flu Vaccine Trivalent High Dose (Fluad)   PSA, Medicare   Lipid panel   Comprehensive metabolic panel   POCT Urinalysis Dipstick (Automated)   EKG 12-Lead    Kerby Nora, MD

## 2023-06-21 NOTE — Assessment & Plan Note (Addendum)
Chronic, stable control   Start Finasteride 5 mg daily.Marland Kitchen

## 2023-07-13 ENCOUNTER — Ambulatory Visit (INDEPENDENT_AMBULATORY_CARE_PROVIDER_SITE_OTHER): Payer: Medicare Other

## 2023-07-13 VITALS — Ht 63.0 in | Wt 116.0 lb

## 2023-07-13 DIAGNOSIS — Z0000 Encounter for general adult medical examination without abnormal findings: Secondary | ICD-10-CM

## 2023-07-13 NOTE — Patient Instructions (Signed)
Mr. Aslin , Thank you for taking time to come for your Medicare Wellness Visit. I appreciate your ongoing commitment to your health goals. Please review the following plan we discussed and let me know if I can assist you in the future.   Referrals/Orders/Follow-Ups/Clinician Recommendations: none  This is a list of the screening recommended for you and due dates:  Health Maintenance  Topic Date Due   COVID-19 Vaccine (4 - 2024-25 season) 03/18/2023   Medicare Annual Wellness Visit  07/12/2024   Colon Cancer Screening  03/15/2026   DTaP/Tdap/Td vaccine (3 - Td or Tdap) 09/22/2030   Pneumonia Vaccine  Completed   Flu Shot  Completed   Hepatitis C Screening  Completed   Zoster (Shingles) Vaccine  Completed   HPV Vaccine  Aged Out    Advanced directives: (Declined) Advance directive discussed with you today. Even though you declined this today, please call our office should you change your mind, and we can give you the proper paperwork for you to fill out.  Next Medicare Annual Wellness Visit scheduled for next year: Yes 07/15/24 @ 1pm televisit
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
## 2023-10-08 IMAGING — MR MR PROSTATE WO/W CM
12 series · 48 of 48 positions shown · IV contrast (MULTIHANCE)
Comparison: 07/06/2014

CLINICAL DATA: Elevated PSA level of 14.5 on 06/06/2021. Prostate
biopsy 10/08/2017 was benign.

EXAM:
MR PROSTATE WITHOUT AND WITH CONTRAST
TECHNIQUE: Multiplanar multisequence MRI images were obtained of the pelvis
centered about the prostate. Pre and post contrast images were
obtained.
CONTRAST:  10mL MULTIHANCE GADOBENATE DIMEGLUMINE 529 MG/ML IV SOLN

[Series 3: T2 · coronal · 3.0mm · 0.56mm/px · 1 of 23 slices shown (1 of 3)]
[im 1/23]
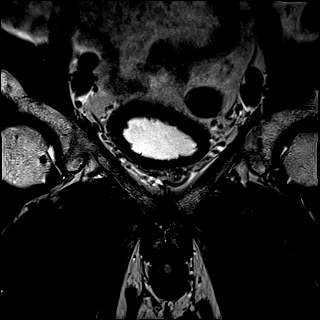

[Series 4: T1 · axial · 5.0mm · 1.25mm/px · 1 of 72 slices shown]
[im 1/72]
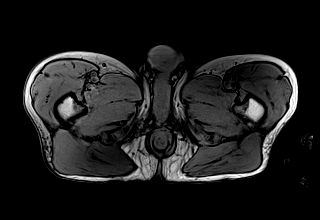

[Series 5: DWI · axial · 3.0mm · 1.75mm/px · z∈[-101,-29]mm · 2 of 75 slices shown (1 of 3)]
[im 1/75]
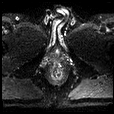
[im 75/75]
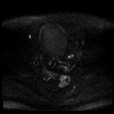

[Series 6: DWI · axial · 3.0mm · 1.75mm/px · 1 of 25 slices shown (2 of 3)]
[im 1/25]
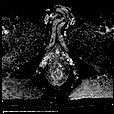

[Series 7: DWI · axial · 3.0mm · 1.75mm/px · 1 of 25 slices shown (3 of 3)]
[im 1/25]
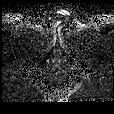

[Series 8: T2 · axial · 3.0mm · 0.56mm/px · 1 of 25 slices shown (2 of 3)]
[im 1/25]
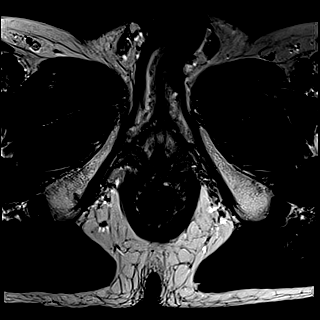

[Series 9: T2 · axial · 1.0mm · 1.04mm/px · z∈[-101,-30]mm · 2 of 72 slices shown (3 of 3)]
[im 1/72]
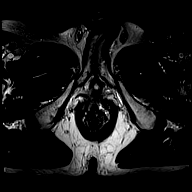
[im 72/72]
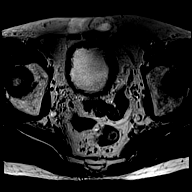

[Series 10: pre t1_twist_tra_dyn · axial · non-contrast · 3.5mm · 0.83mm/px · 1 of 20 slices shown]
[im 1/20]
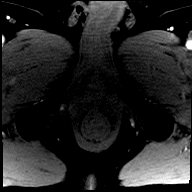

[Series 11: post t1_twist_tra_dyn-copy center · axial · non-contrast · 3.5mm · 0.83mm/px · z∈[-99,-32]mm · 17 of 600 slices shown]
[im 1/600]
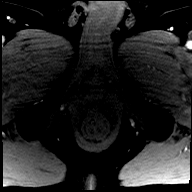
[im 38/600]
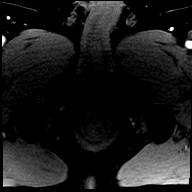
[im 75/600]
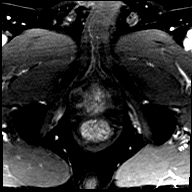
[im 113/600]
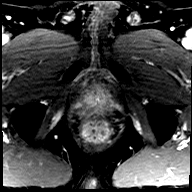
[im 150/600]
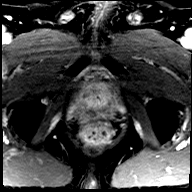
[im 188/600]
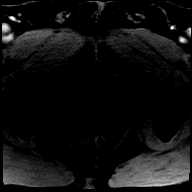
[im 225/600]
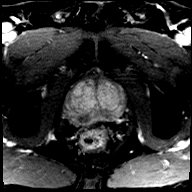
[im 263/600]
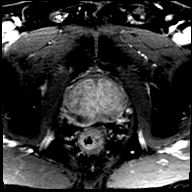
[im 300/600]
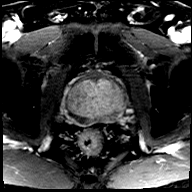
[im 337/600]
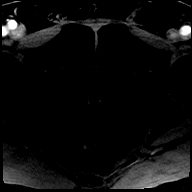
[im 375/600]
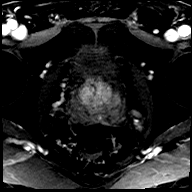
[im 412/600]
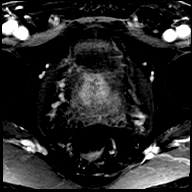
[im 450/600]
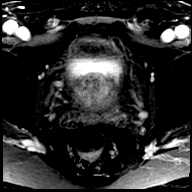
[im 487/600]
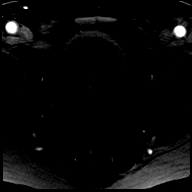
[im 525/600]
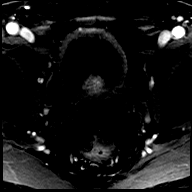
[im 562/600]
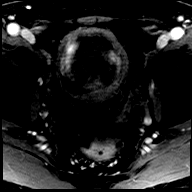
[im 600/600]
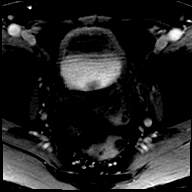

[Series 12: post t1_twist_tra_dyn-copy cent_sub · axial · 3.5mm · 0.83mm/px · z∈[-99,-32]mm · 17 of 580 slices shown]
[im 1/580]
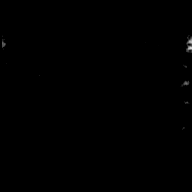
[im 37/580]
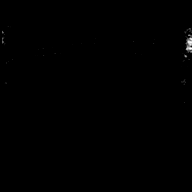
[im 73/580]
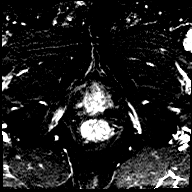
[im 109/580]
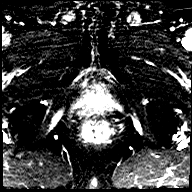
[im 145/580]
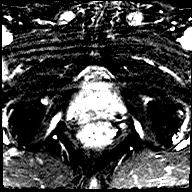
[im 181/580]
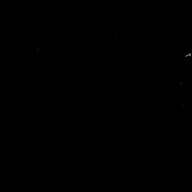
[im 218/580]
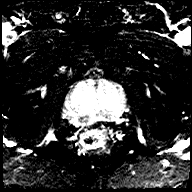
[im 254/580]
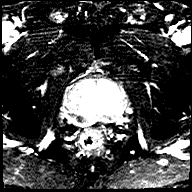
[im 290/580]
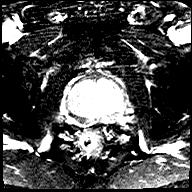
[im 326/580]
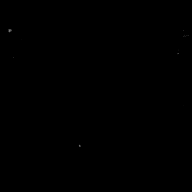
[im 362/580]
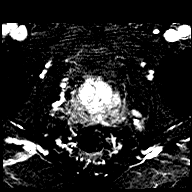
[im 399/580]
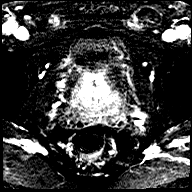
[im 435/580]
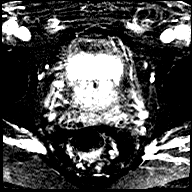
[im 471/580]
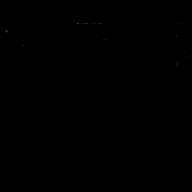
[im 507/580]
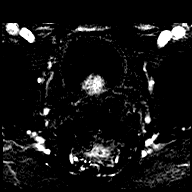
[im 543/580]
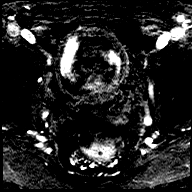
[im 580/580]
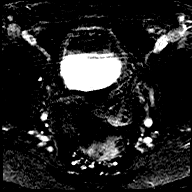

[Series 13: t1_vibe_dixon_tra_f · axial · 2.5mm · 0.91mm/px · z∈[-126,+71]mm · 2 of 80 slices shown]
[im 1/80]
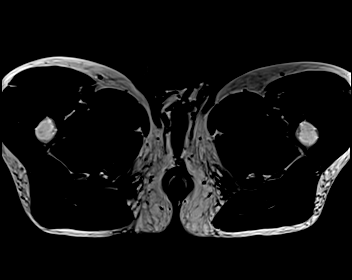
[im 80/80]
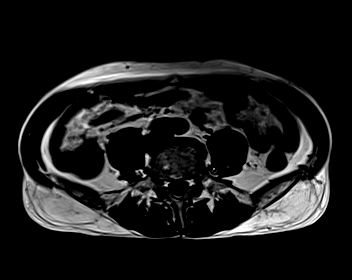

[Series 14: t1_vibe_dixon_tra_w · axial · 2.5mm · 0.91mm/px · z∈[-126,+71]mm · 2 of 80 slices shown]
[im 1/80]
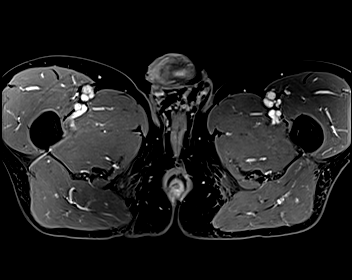
[im 80/80]
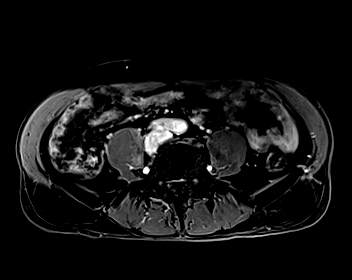

[48 of 48 positions shown; findings below may reference images not displayed]

FINDINGS: Prostate: Hazy low signal stranding in the peripheral zone
bilaterally is nonfocal and likely postinflammatory, considered
PI-RADS category 2.

Region of interest # 1: PI-RADS category 3 lesion of the
posteromedial peripheral zone bilaterally, with low signal intensity
continuous with the central zone spanning down further apically than
is typically encountered, as shown for example on image 44 series 9
and image 17 series 3. No substantial degree of restriction of
diffusion or associated significant enhancement. This measures about
0.40 cc (0.9 by 0.4 by 1.0 cm) and could alternatively simply
represent unusual inferior extension of the central zone.

Mild encapsulated nodularity in the transition zone compatible with
benign prostatic hypertrophy. The median lobe indents the bladder
base.

Volume: 3D volumetric analysis: Prostate volume 58.21 cc (5.3 by
by 5.7 cm).

Transcapsular spread:  Absent

Seminal vesicle involvement: Absent

Neurovascular bundle involvement: Absent

Pelvic adenopathy: Absent

Bone metastasis: Absent

Other findings: No supplemental non-categorized findings.
IMPRESSION: 1. Midline PI-RADS category 3 lesion of the posteromedial peripheral
zone bilaterally. Although this could simply represent an unusual
normal variant inferior extension of the central zone, the extension
of low T2 signal down nearly to the apex is unusual. Targeting data
sent to [facility].
2. Mild prostatomegaly and benign prostatic hypertrophy.

## 2023-12-03 ENCOUNTER — Telehealth: Payer: Self-pay | Admitting: *Deleted

## 2023-12-03 DIAGNOSIS — E7800 Pure hypercholesterolemia, unspecified: Secondary | ICD-10-CM

## 2023-12-03 DIAGNOSIS — R972 Elevated prostate specific antigen [PSA]: Secondary | ICD-10-CM

## 2023-12-03 NOTE — Telephone Encounter (Signed)
-----   Message from Gerry Krone sent at 12/03/2023 11:16 AM EDT ----- Regarding: Lab orders for [facility], 6.5.25 Lab orders for a 6 month follow up appt

## 2023-12-20 ENCOUNTER — Other Ambulatory Visit: Payer: Medicare Other

## 2023-12-21 ENCOUNTER — Other Ambulatory Visit (INDEPENDENT_AMBULATORY_CARE_PROVIDER_SITE_OTHER)

## 2023-12-21 DIAGNOSIS — R972 Elevated prostate specific antigen [PSA]: Secondary | ICD-10-CM

## 2023-12-27 ENCOUNTER — Ambulatory Visit: Payer: Self-pay | Admitting: Family Medicine

## 2023-12-27 LAB — PSA, MEDICARE: PSA: 5.78 ng/mL — ABNORMAL HIGH (ref 0.10–4.00)

## 2024-06-04 ENCOUNTER — Telehealth: Payer: Self-pay | Admitting: *Deleted

## 2024-06-04 DIAGNOSIS — R972 Elevated prostate specific antigen [PSA]: Secondary | ICD-10-CM

## 2024-06-04 DIAGNOSIS — E7800 Pure hypercholesterolemia, unspecified: Secondary | ICD-10-CM

## 2024-06-04 NOTE — Telephone Encounter (Signed)
-----   Message from Harlene Du sent at 06/04/2024  1:43 PM EST ----- Regarding: Labs Tues 06/17/24 Hello,  Patient is coming in for CPE labs. Can we get orders please.   Thanks

## 2024-06-06 ENCOUNTER — Other Ambulatory Visit: Payer: Self-pay | Admitting: Family Medicine

## 2024-06-17 ENCOUNTER — Ambulatory Visit: Payer: Self-pay | Admitting: Family Medicine

## 2024-06-17 ENCOUNTER — Other Ambulatory Visit (INDEPENDENT_AMBULATORY_CARE_PROVIDER_SITE_OTHER): Payer: Medicare Other

## 2024-06-17 DIAGNOSIS — R972 Elevated prostate specific antigen [PSA]: Secondary | ICD-10-CM | POA: Diagnosis not present

## 2024-06-17 DIAGNOSIS — E7800 Pure hypercholesterolemia, unspecified: Secondary | ICD-10-CM | POA: Diagnosis not present

## 2024-06-17 LAB — COMPREHENSIVE METABOLIC PANEL WITH GFR
ALT: 41 U/L (ref 0–53)
AST: 33 U/L (ref 0–37)
Albumin: 4.5 g/dL (ref 3.5–5.2)
Alkaline Phosphatase: 80 U/L (ref 39–117)
BUN: 19 mg/dL (ref 6–23)
CO2: 26 meq/L (ref 19–32)
Calcium: 9.4 mg/dL (ref 8.4–10.5)
Chloride: 105 meq/L (ref 96–112)
Creatinine, Ser: 1.15 mg/dL (ref 0.40–1.50)
GFR: 63.72 mL/min (ref >60.00)
Glucose, Bld: 96 mg/dL (ref 70–99)
Potassium: 4.4 meq/L (ref 3.5–5.1)
Sodium: 138 meq/L (ref 135–145)
Total Bilirubin: 1.2 mg/dL (ref 0.2–1.2)
Total Protein: 7.5 g/dL (ref 6.0–8.3)

## 2024-06-17 LAB — LIPID PANEL
Cholesterol: 179 mg/dL (ref 0–200)
HDL: 56.8 mg/dL (ref >39.00)
LDL Cholesterol: 99 mg/dL (ref 0–99)
NonHDL: 121.93
Total CHOL/HDL Ratio: 3
Triglycerides: 117 mg/dL (ref 0.0–149.0)
VLDL: 23.4 mg/dL (ref 0.0–40.0)

## 2024-06-17 LAB — PSA, MEDICARE: PSA: 11.03 ng/mL — ABNORMAL HIGH (ref 0.10–4.00)

## 2024-06-17 NOTE — Progress Notes (Signed)
 No critical labs need to be addressed urgently. We will discuss labs in detail at upcoming office visit.

## 2024-06-24 ENCOUNTER — Encounter: Payer: Medicare Other | Admitting: Family Medicine

## 2024-06-27 ENCOUNTER — Ambulatory Visit: Admitting: Family Medicine

## 2024-06-27 ENCOUNTER — Encounter: Payer: Self-pay | Admitting: Family Medicine

## 2024-06-27 VITALS — BP 120/80 | HR 91 | Temp 98.0°F | Ht 61.5 in | Wt 121.2 lb

## 2024-06-27 DIAGNOSIS — R972 Elevated prostate specific antigen [PSA]: Secondary | ICD-10-CM

## 2024-06-27 DIAGNOSIS — I10 Essential (primary) hypertension: Secondary | ICD-10-CM

## 2024-06-27 DIAGNOSIS — Z0000 Encounter for general adult medical examination without abnormal findings: Secondary | ICD-10-CM | POA: Diagnosis not present

## 2024-06-27 DIAGNOSIS — E7800 Pure hypercholesterolemia, unspecified: Secondary | ICD-10-CM

## 2024-06-27 DIAGNOSIS — Z23 Encounter for immunization: Secondary | ICD-10-CM | POA: Diagnosis not present

## 2024-06-27 DIAGNOSIS — R0789 Other chest pain: Secondary | ICD-10-CM

## 2024-06-27 NOTE — Assessment & Plan Note (Signed)
 Chronic, good control on current regimen.  Atorvastatin 40 mg p.o. daily

## 2024-06-27 NOTE — Progress Notes (Signed)
 Patient ID: Carl Harris, male    DOB: 1950/12/10, 73 y.o.   MRN: [id number]  This visit was conducted in person.  BP 120/80   Pulse 91   Temp 98 F (36.7 C) (Temporal)   Ht 5' 1.5 (1.562 m)   Wt 121 lb 4 oz (55 kg)   SpO2 97%   BMI 22.54 kg/m    CC:  Chief Complaint  Patient presents with   Annual Exam    MWV scheduled for 07/15/2024    Subjective:   HPI: Carl Harris is a 73 y.o. male presenting on 06/27/2024 for Annual Exam (MWV scheduled for 07/15/2024)  The patient presents for complete physical and review of chronic health problems. He/She also has the following acute concerns today:   No further chest pain like last year.  Yesterday di have sharp pain in inleft lateral chest.  No SOB, no sweating.   No prostate pain,   no night waking. Annual Medicare wellness scheduled for December 30/2025     Elevated Cholesterol: Good control on atorvastatin  40 mg daily Lab Results  Component Value Date   CHOL 179 06/17/2024   HDL 56.80 06/17/2024   LDLCALC 99 06/17/2024   LDLDIRECT 228.5 06/24/2013   TRIG 117.0 06/17/2024   CHOLHDL 3 06/17/2024  Using medications without problems: Muscle aches:  Diet compliance: heart healthy Exercise: Daily exercise daily. Other complaints:  Hypertension:  Well-controlled on amlodipine  benazepril  5/10 mg p.o. daily BP Readings from Last 3 Encounters:  06/27/24 120/80  06/21/23 116/80  06/14/22 138/80  Using medication without problems or lightheadedness:  none Chest pain with exertion: none Edema:none Short of breath: Average home BPs: Other issues:    Chronic insomnia:  Improved sleeping overall.   BPH:  finasteride  is helping in last year.   Relevant past medical, surgical, family and social history reviewed and updated as indicated. Interim medical history since our last visit reviewed. Allergies and medications reviewed and updated. Outpatient Medications Prior to Visit  Medication Sig Dispense Refill    amLODipine -benazepril  (LOTREL) 5-10 MG capsule Take 1 capsule by mouth daily. 90 capsule 3   atorvastatin  (LIPITOR) 40 MG tablet Take 1 tablet (40 mg total) by mouth daily. 90 tablet 3   finasteride  (PROSCAR ) 5 MG tablet TAKE 1 TABLET(5 MG) BY MOUTH DAILY 90 tablet 0   VITAMIN D PO Take by mouth.     No facility-administered medications prior to visit.     Per HPI unless specifically indicated in ROS section below Review of Systems  Constitutional:  Negative for fatigue and fever.  HENT:  Negative for ear pain.   Eyes:  Negative for pain.  Respiratory:  Negative for cough and shortness of breath.   Cardiovascular:  Negative for chest pain, palpitations and leg swelling.  Gastrointestinal:  Negative for abdominal pain.  Genitourinary:  Negative for dysuria.  Musculoskeletal:  Negative for arthralgias.  Neurological:  Negative for syncope, light-headedness and headaches.  Psychiatric/Behavioral:  Negative for dysphoric mood.    Objective:  BP 120/80   Pulse 91   Temp 98 F (36.7 C) (Temporal)   Ht 5' 1.5 (1.562 m)   Wt 121 lb 4 oz (55 kg)   SpO2 97%   BMI 22.54 kg/m   Wt Readings from Last 3 Encounters:  06/27/24 121 lb 4 oz (55 kg)  07/13/23 116 lb (52.6 kg)  06/21/23 116 lb 6 oz (52.8 kg)      Physical Exam Constitutional:  General: He is not in acute distress.    Appearance: Normal appearance. He is well-developed. He is not ill-appearing or toxic-appearing.  HENT:     Head: Normocephalic and atraumatic.     Right Ear: Hearing, tympanic membrane, ear canal and external ear normal.     Left Ear: Hearing, tympanic membrane, ear canal and external ear normal.     Nose: Nose normal.     Mouth/Throat:     Pharynx: Uvula midline.  Eyes:     General: Lids are normal. Lids are everted, no foreign bodies appreciated.     Conjunctiva/sclera: Conjunctivae normal.     Pupils: Pupils are equal, round, and reactive to light.  Neck:     Thyroid : No thyroid  mass or  thyromegaly.     Vascular: No carotid bruit.     Trachea: Trachea and phonation normal.  Cardiovascular:     Rate and Rhythm: Normal rate and regular rhythm.     Pulses: Normal pulses.     Heart sounds: S1 normal and S2 normal. No murmur heard.    No gallop.  Pulmonary:     Breath sounds: Normal breath sounds. No wheezing, rhonchi or rales.  Abdominal:     General: Bowel sounds are normal.     Palpations: Abdomen is soft.     Tenderness: There is no abdominal tenderness. There is no guarding or rebound.     Hernia: No hernia is present.  Musculoskeletal:     Cervical back: Normal range of motion and neck supple.  Lymphadenopathy:     Cervical: No cervical adenopathy.  Skin:    General: Skin is warm and dry.     Findings: No rash.  Neurological:     Mental Status: He is alert.     Cranial Nerves: No cranial nerve deficit.     Sensory: No sensory deficit.     Gait: Gait normal.     Deep Tendon Reflexes: Reflexes are normal and symmetric.  Psychiatric:        Speech: Speech normal.        Behavior: Behavior normal.        Judgment: Judgment normal.       Results for orders placed or performed in visit on 06/17/24  PSA, Medicare   Collection Time: 06/17/24  9:09 AM  Result Value Ref Range   PSA 11.03 (H) 0.10 - 4.00 ng/ml  Lipid panel   Collection Time: 06/17/24  9:09 AM  Result Value Ref Range   Cholesterol 179 0 - 200 mg/dL   Triglycerides 882.9 0.0 - 149.0 mg/dL   HDL 43.19 >60.99 mg/dL   VLDL 76.5 0.0 - 59.9 mg/dL   LDL Cholesterol 99 0 - 99 mg/dL   Total CHOL/HDL Ratio 3    NonHDL 121.93   Comprehensive metabolic panel   Collection Time: 06/17/24  9:09 AM  Result Value Ref Range   Sodium 138 135 - 145 mEq/L   Potassium 4.4 3.5 - 5.1 mEq/L   Chloride 105 96 - 112 mEq/L   CO2 26 19 - 32 mEq/L   Glucose, Bld 96 70 - 99 mg/dL   BUN 19 6 - 23 mg/dL   Creatinine, Ser 8.84 0.40 - 1.50 mg/dL   Total Bilirubin 1.2 0.2 - 1.2 mg/dL   Alkaline Phosphatase 80 39 - 117  U/L   AST 33 0 - 37 U/L   ALT 41 0 - 53 U/L   Total Protein 7.5 6.0 - 8.3 g/dL  Albumin 4.5 3.5 - 5.2 g/dL   GFR 36.27 >39.99 mL/min   Calcium  9.4 8.4 - 10.5 mg/dL     COVID 19 screen:  No recent travel or known exposure to COVID19 The patient denies respiratory symptoms of COVID 19 at this time. The importance of social distancing was discussed today.   Assessment and Plan   The patient's preventative maintenance and recommended screening tests for an annual wellness exam were reviewed in full today. Brought up to date unless services declined.  Counselled on the importance of diet, exercise, and its role in overall health and mortality. The patient's FH and SH was reviewed, including their home life, tobacco status, and drug and alcohol status.   Given flu vaccine, completed PNA series, shingrix, 4th COVID  Uptodate with Td. Colonoscopy 02/2021, repeat in 5 years  Nonsmoker  Hep C: done  No ETOH                      PSA elevated in past .. followed by [facility].. reviewed OV note from 08/19/2021..  Given negative past prostate biopsy and largely normal MRI prostate decision was made to follow PSA every 6 months, started on finasteride  5 mg once daily.... he states he never took and was unable to return. Lab Results  Component Value Date   PSA 11.03 (H) 06/17/2024   PSA 5.78 (H) 12/21/2023   PSA 13.02 (H) 06/21/2023   Given low risk prostate MRI and negative biopsies.. plan q 6 month PSA.  Problem List Items Addressed This Visit     Atypical chest pain   Acute, no issues in last year... until yesterday pain in left lateral chest wall lasting an hour. No associated symptoms.  No issue since.  Able to exercise jumping jacks with no chest pain.      Elevated PSA   Chronic, he has not seen [facility] since February 2023. Check every 89-month for stability given low risk prostate MRI and negative biopsies.   Given PSA trending up will refer back to urology.         HTN (hypertension)   Stable, chronic.  Continue current medication.  Well-controlled on amlodipine /benazepril  5/10 mg p.o. daily      HYPERCHOLESTEROLEMIA   Chronic, good control on current regimen.  Atorvastatin  40 mg p.o. daily      Other Visit Diagnoses       Routine general medical examination at a health care facility    -  Primary     Need for influenza vaccination       Relevant Orders   Flu vaccine HIGH DOSE PF(Fluzone Trivalent) (Completed)      Orders Placed This Encounter  Procedures   Flu vaccine HIGH DOSE PF(Fluzone Trivalent)    Carl Ring, MD

## 2024-06-27 NOTE — Assessment & Plan Note (Signed)
 Acute, no issues in last year... until yesterday pain in left lateral chest wall lasting an hour. No associated symptoms.  No issue since.  Able to exercise jumping jacks with no chest pain.

## 2024-06-27 NOTE — Assessment & Plan Note (Signed)
 Chronic, he has not seen [facility] since February 2023. Check every 45-month for stability given low risk prostate MRI and negative biopsies.   Given PSA trending up will refer back to urology.

## 2024-06-27 NOTE — Assessment & Plan Note (Signed)
Stable, chronic.  Continue current medication.  Well-controlled on amlodipine benazepril 5/10 mg p.o. daily

## 2024-06-27 NOTE — Patient Instructions (Addendum)
 Call  [facility]  [phone] to make a follow up appt given PSA is trending up.  Please call for further evaluation.  Follow up if chest pain recurs.  Can start vit D OTC 2000 international Units  daily

## 2024-08-08 ENCOUNTER — Ambulatory Visit

## 2024-08-08 VITALS — Ht 61.0 in | Wt 120.0 lb

## 2024-08-08 DIAGNOSIS — Z0000 Encounter for general adult medical examination without abnormal findings: Secondary | ICD-10-CM | POA: Diagnosis not present

## 2024-08-08 NOTE — Patient Instructions (Addendum)
 Carl Harris,  Thank you for taking the time for your Medicare Wellness Visit. I appreciate your continued commitment to your health goals. Please review the care plan we discussed, and feel free to reach out if I can assist you further.  Please note that Annual Wellness Visits do not include a physical exam. Some assessments may be limited, especially if the visit was conducted virtually. If needed, we may recommend an in-person follow-up with your provider.  Ongoing Care Seeing your primary care provider every 3 to 6 months helps us  monitor your health and provide consistent, personalized care.   Referrals If a referral was made during today's visit and you haven't received any updates within two weeks, please contact the referred provider directly to check on the status.  Recommended Screenings:  Health Maintenance  Topic Date Due   COVID-19 Vaccine (4 - 2025-26 season) 03/17/2024   Medicare Annual Wellness Visit  08/08/2025   Colon Cancer Screening  03/15/2026   DTaP/Tdap/Td vaccine (3 - Td or Tdap) 09/22/2030   Pneumococcal Vaccine for age over 3  Completed   Flu Shot  Completed   Hepatitis C Screening  Completed   Zoster (Shingles) Vaccine  Completed   Meningitis B Vaccine  Aged Out       08/08/2024    3:48 PM  Advanced Directives  Does Patient Have a Medical Advance Directive? No  Would patient like information on creating a medical advance directive? No - Patient declined    Vision: Annual vision screenings are recommended for early detection of glaucoma, cataracts, and diabetic retinopathy. These exams can also reveal signs of chronic conditions such as diabetes and high blood pressure.  Dental: Annual dental screenings help detect early signs of oral cancer, gum disease, and other conditions linked to overall health, including heart disease and diabetes.
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
## 2024-12-30 ENCOUNTER — Ambulatory Visit: Admitting: Family Medicine

## 2025-07-07 ENCOUNTER — Encounter: Admitting: Family Medicine

## 2025-08-13 ENCOUNTER — Ambulatory Visit
